# Patient Record
Sex: Female | Born: 1935 | Race: White | Hispanic: No | Smoking: Former smoker
Health system: Southern US, Community
[De-identification: ages and names within clinical notes are randomized; demographics above are authoritative.]

## PROBLEM LIST (undated history)

## (undated) DIAGNOSIS — I8390 Asymptomatic varicose veins of unspecified lower extremity: Secondary | ICD-10-CM

## (undated) DIAGNOSIS — M810 Age-related osteoporosis without current pathological fracture: Secondary | ICD-10-CM

## (undated) DIAGNOSIS — H3530 Unspecified macular degeneration: Secondary | ICD-10-CM

## (undated) DIAGNOSIS — F4321 Adjustment disorder with depressed mood: Secondary | ICD-10-CM

## (undated) DIAGNOSIS — M79606 Pain in leg, unspecified: Secondary | ICD-10-CM

## (undated) DIAGNOSIS — G4700 Insomnia, unspecified: Secondary | ICD-10-CM

## (undated) DIAGNOSIS — J449 Chronic obstructive pulmonary disease, unspecified: Secondary | ICD-10-CM

## (undated) DIAGNOSIS — I10 Essential (primary) hypertension: Secondary | ICD-10-CM

## (undated) DIAGNOSIS — E785 Hyperlipidemia, unspecified: Secondary | ICD-10-CM

## (undated) HISTORY — DX: Essential (primary) hypertension: I10

## (undated) HISTORY — DX: Unspecified macular degeneration: H35.30

## (undated) HISTORY — DX: Adjustment disorder with depressed mood: F43.21

## (undated) HISTORY — DX: Asymptomatic varicose veins of unspecified lower extremity: I83.90

## (undated) HISTORY — DX: Insomnia, unspecified: G47.00

## (undated) HISTORY — DX: Hyperlipidemia, unspecified: E78.5

## (undated) HISTORY — DX: Chronic obstructive pulmonary disease, unspecified: J44.9

## (undated) HISTORY — DX: Age-related osteoporosis without current pathological fracture: M81.0

## (undated) HISTORY — PX: OTHER SURGICAL HISTORY: SHX169

## (undated) HISTORY — DX: Pain in leg, unspecified: M79.606

---

## 1973-10-06 HISTORY — PX: APPENDECTOMY: SHX54

## 1998-03-29 ENCOUNTER — Other Ambulatory Visit: Admission: RE | Admit: 1998-03-29 | Discharge: 1998-03-29 | Payer: Self-pay | Admitting: Obstetrics and Gynecology

## 2011-11-17 DIAGNOSIS — B9789 Other viral agents as the cause of diseases classified elsewhere: Secondary | ICD-10-CM | POA: Diagnosis not present

## 2011-11-28 DIAGNOSIS — J449 Chronic obstructive pulmonary disease, unspecified: Secondary | ICD-10-CM | POA: Diagnosis not present

## 2011-11-28 DIAGNOSIS — R05 Cough: Secondary | ICD-10-CM | POA: Diagnosis not present

## 2011-11-28 DIAGNOSIS — J42 Unspecified chronic bronchitis: Secondary | ICD-10-CM | POA: Diagnosis not present

## 2011-11-28 DIAGNOSIS — J310 Chronic rhinitis: Secondary | ICD-10-CM | POA: Diagnosis not present

## 2011-12-12 DIAGNOSIS — J4489 Other specified chronic obstructive pulmonary disease: Secondary | ICD-10-CM | POA: Diagnosis not present

## 2011-12-12 DIAGNOSIS — J310 Chronic rhinitis: Secondary | ICD-10-CM | POA: Diagnosis not present

## 2011-12-12 DIAGNOSIS — R059 Cough, unspecified: Secondary | ICD-10-CM | POA: Diagnosis not present

## 2011-12-12 DIAGNOSIS — J449 Chronic obstructive pulmonary disease, unspecified: Secondary | ICD-10-CM | POA: Diagnosis not present

## 2011-12-12 DIAGNOSIS — R05 Cough: Secondary | ICD-10-CM | POA: Diagnosis not present

## 2011-12-22 DIAGNOSIS — M2040 Other hammer toe(s) (acquired), unspecified foot: Secondary | ICD-10-CM | POA: Diagnosis not present

## 2011-12-22 DIAGNOSIS — L84 Corns and callosities: Secondary | ICD-10-CM | POA: Diagnosis not present

## 2012-05-19 DIAGNOSIS — M1710 Unilateral primary osteoarthritis, unspecified knee: Secondary | ICD-10-CM | POA: Diagnosis not present

## 2012-05-19 DIAGNOSIS — M25569 Pain in unspecified knee: Secondary | ICD-10-CM | POA: Diagnosis not present

## 2012-05-28 DIAGNOSIS — J209 Acute bronchitis, unspecified: Secondary | ICD-10-CM | POA: Diagnosis not present

## 2012-05-28 DIAGNOSIS — J449 Chronic obstructive pulmonary disease, unspecified: Secondary | ICD-10-CM | POA: Diagnosis not present

## 2012-05-28 DIAGNOSIS — J0100 Acute maxillary sinusitis, unspecified: Secondary | ICD-10-CM | POA: Diagnosis not present

## 2012-05-28 DIAGNOSIS — M25569 Pain in unspecified knee: Secondary | ICD-10-CM | POA: Diagnosis not present

## 2012-06-10 DIAGNOSIS — S53126A Posterior dislocation of unspecified ulnohumeral joint, initial encounter: Secondary | ICD-10-CM | POA: Diagnosis not present

## 2012-06-10 DIAGNOSIS — W010XXA Fall on same level from slipping, tripping and stumbling without subsequent striking against object, initial encounter: Secondary | ICD-10-CM | POA: Diagnosis not present

## 2012-06-10 DIAGNOSIS — J449 Chronic obstructive pulmonary disease, unspecified: Secondary | ICD-10-CM | POA: Diagnosis not present

## 2012-06-10 DIAGNOSIS — Z87891 Personal history of nicotine dependence: Secondary | ICD-10-CM | POA: Diagnosis not present

## 2012-06-10 DIAGNOSIS — S52023A Displaced fracture of olecranon process without intraarticular extension of unspecified ulna, initial encounter for closed fracture: Secondary | ICD-10-CM | POA: Diagnosis not present

## 2012-06-10 DIAGNOSIS — J4489 Other specified chronic obstructive pulmonary disease: Secondary | ICD-10-CM | POA: Diagnosis not present

## 2012-06-10 DIAGNOSIS — S93409A Sprain of unspecified ligament of unspecified ankle, initial encounter: Secondary | ICD-10-CM | POA: Diagnosis not present

## 2012-06-23 DIAGNOSIS — S52043A Displaced fracture of coronoid process of unspecified ulna, initial encounter for closed fracture: Secondary | ICD-10-CM | POA: Diagnosis not present

## 2012-06-28 DIAGNOSIS — S52043A Displaced fracture of coronoid process of unspecified ulna, initial encounter for closed fracture: Secondary | ICD-10-CM | POA: Diagnosis not present

## 2012-06-28 DIAGNOSIS — M25529 Pain in unspecified elbow: Secondary | ICD-10-CM | POA: Diagnosis not present

## 2012-07-02 DIAGNOSIS — M25529 Pain in unspecified elbow: Secondary | ICD-10-CM | POA: Diagnosis not present

## 2012-07-05 DIAGNOSIS — M25529 Pain in unspecified elbow: Secondary | ICD-10-CM | POA: Diagnosis not present

## 2012-07-06 ENCOUNTER — Telehealth: Payer: Self-pay | Admitting: Pulmonary Disease

## 2012-07-06 NOTE — Telephone Encounter (Signed)
I spoke with Ms. Sylvia Suarez and informed her that the results of her Inspiratory and expiratory pressure testing was remarkably low, confirming my suspicion of respiratory muscle weakness.  Based on this we will refer her for neurology evaluation.

## 2012-07-06 NOTE — Telephone Encounter (Signed)
LAST NOTE WRITTEN IN ERROR

## 2012-07-09 DIAGNOSIS — M25529 Pain in unspecified elbow: Secondary | ICD-10-CM | POA: Diagnosis not present

## 2012-07-12 DIAGNOSIS — M25529 Pain in unspecified elbow: Secondary | ICD-10-CM | POA: Diagnosis not present

## 2012-07-16 DIAGNOSIS — M25529 Pain in unspecified elbow: Secondary | ICD-10-CM | POA: Diagnosis not present

## 2012-07-20 DIAGNOSIS — M25529 Pain in unspecified elbow: Secondary | ICD-10-CM | POA: Diagnosis not present

## 2012-07-21 DIAGNOSIS — S52043A Displaced fracture of coronoid process of unspecified ulna, initial encounter for closed fracture: Secondary | ICD-10-CM | POA: Diagnosis not present

## 2012-07-23 DIAGNOSIS — M25529 Pain in unspecified elbow: Secondary | ICD-10-CM | POA: Diagnosis not present

## 2012-07-27 DIAGNOSIS — M25529 Pain in unspecified elbow: Secondary | ICD-10-CM | POA: Diagnosis not present

## 2012-07-30 DIAGNOSIS — M25529 Pain in unspecified elbow: Secondary | ICD-10-CM | POA: Diagnosis not present

## 2012-08-03 DIAGNOSIS — M25529 Pain in unspecified elbow: Secondary | ICD-10-CM | POA: Diagnosis not present

## 2012-08-06 DIAGNOSIS — M25529 Pain in unspecified elbow: Secondary | ICD-10-CM | POA: Diagnosis not present

## 2012-08-13 DIAGNOSIS — M25529 Pain in unspecified elbow: Secondary | ICD-10-CM | POA: Diagnosis not present

## 2012-08-18 DIAGNOSIS — M25529 Pain in unspecified elbow: Secondary | ICD-10-CM | POA: Diagnosis not present

## 2012-08-18 DIAGNOSIS — S52043A Displaced fracture of coronoid process of unspecified ulna, initial encounter for closed fracture: Secondary | ICD-10-CM | POA: Diagnosis not present

## 2012-08-23 DIAGNOSIS — M25529 Pain in unspecified elbow: Secondary | ICD-10-CM | POA: Diagnosis not present

## 2012-08-23 DIAGNOSIS — S52043A Displaced fracture of coronoid process of unspecified ulna, initial encounter for closed fracture: Secondary | ICD-10-CM | POA: Diagnosis not present

## 2012-08-30 DIAGNOSIS — M25529 Pain in unspecified elbow: Secondary | ICD-10-CM | POA: Diagnosis not present

## 2012-08-30 DIAGNOSIS — S52043A Displaced fracture of coronoid process of unspecified ulna, initial encounter for closed fracture: Secondary | ICD-10-CM | POA: Diagnosis not present

## 2012-10-09 DIAGNOSIS — J209 Acute bronchitis, unspecified: Secondary | ICD-10-CM | POA: Diagnosis not present

## 2012-10-09 DIAGNOSIS — J0100 Acute maxillary sinusitis, unspecified: Secondary | ICD-10-CM | POA: Diagnosis not present

## 2012-10-14 DIAGNOSIS — F411 Generalized anxiety disorder: Secondary | ICD-10-CM | POA: Diagnosis not present

## 2012-10-14 DIAGNOSIS — J449 Chronic obstructive pulmonary disease, unspecified: Secondary | ICD-10-CM | POA: Diagnosis not present

## 2012-10-14 DIAGNOSIS — F410 Panic disorder [episodic paroxysmal anxiety] without agoraphobia: Secondary | ICD-10-CM | POA: Diagnosis not present

## 2012-10-14 DIAGNOSIS — I10 Essential (primary) hypertension: Secondary | ICD-10-CM | POA: Diagnosis not present

## 2012-10-15 DIAGNOSIS — I10 Essential (primary) hypertension: Secondary | ICD-10-CM | POA: Diagnosis not present

## 2012-10-15 DIAGNOSIS — F411 Generalized anxiety disorder: Secondary | ICD-10-CM | POA: Diagnosis not present

## 2012-11-02 DIAGNOSIS — R0609 Other forms of dyspnea: Secondary | ICD-10-CM | POA: Diagnosis not present

## 2012-11-02 DIAGNOSIS — R05 Cough: Secondary | ICD-10-CM | POA: Diagnosis not present

## 2012-11-02 DIAGNOSIS — E559 Vitamin D deficiency, unspecified: Secondary | ICD-10-CM | POA: Diagnosis not present

## 2012-11-02 DIAGNOSIS — R0989 Other specified symptoms and signs involving the circulatory and respiratory systems: Secondary | ICD-10-CM | POA: Diagnosis not present

## 2012-11-02 DIAGNOSIS — J310 Chronic rhinitis: Secondary | ICD-10-CM | POA: Diagnosis not present

## 2012-11-10 DIAGNOSIS — IMO0002 Reserved for concepts with insufficient information to code with codable children: Secondary | ICD-10-CM | POA: Diagnosis not present

## 2012-11-10 DIAGNOSIS — I10 Essential (primary) hypertension: Secondary | ICD-10-CM | POA: Diagnosis not present

## 2012-11-10 DIAGNOSIS — F4321 Adjustment disorder with depressed mood: Secondary | ICD-10-CM | POA: Diagnosis not present

## 2012-11-10 DIAGNOSIS — J449 Chronic obstructive pulmonary disease, unspecified: Secondary | ICD-10-CM | POA: Diagnosis not present

## 2012-11-11 DIAGNOSIS — H2589 Other age-related cataract: Secondary | ICD-10-CM | POA: Diagnosis not present

## 2012-11-11 DIAGNOSIS — H43399 Other vitreous opacities, unspecified eye: Secondary | ICD-10-CM | POA: Diagnosis not present

## 2012-11-11 DIAGNOSIS — H52229 Regular astigmatism, unspecified eye: Secondary | ICD-10-CM | POA: Diagnosis not present

## 2012-11-11 DIAGNOSIS — H5200 Hypermetropia, unspecified eye: Secondary | ICD-10-CM | POA: Diagnosis not present

## 2012-11-26 DIAGNOSIS — Z13820 Encounter for screening for osteoporosis: Secondary | ICD-10-CM | POA: Diagnosis not present

## 2012-12-13 DIAGNOSIS — J449 Chronic obstructive pulmonary disease, unspecified: Secondary | ICD-10-CM | POA: Diagnosis not present

## 2012-12-13 DIAGNOSIS — Z6825 Body mass index (BMI) 25.0-25.9, adult: Secondary | ICD-10-CM | POA: Diagnosis not present

## 2012-12-13 DIAGNOSIS — Z780 Asymptomatic menopausal state: Secondary | ICD-10-CM | POA: Diagnosis not present

## 2012-12-13 DIAGNOSIS — I10 Essential (primary) hypertension: Secondary | ICD-10-CM | POA: Diagnosis not present

## 2013-01-11 DIAGNOSIS — S52023A Displaced fracture of olecranon process without intraarticular extension of unspecified ulna, initial encounter for closed fracture: Secondary | ICD-10-CM | POA: Diagnosis not present

## 2013-01-17 DIAGNOSIS — Z0000 Encounter for general adult medical examination without abnormal findings: Secondary | ICD-10-CM | POA: Diagnosis not present

## 2013-01-17 DIAGNOSIS — Z1212 Encounter for screening for malignant neoplasm of rectum: Secondary | ICD-10-CM | POA: Diagnosis not present

## 2013-01-17 DIAGNOSIS — I10 Essential (primary) hypertension: Secondary | ICD-10-CM | POA: Diagnosis not present

## 2013-01-17 DIAGNOSIS — E785 Hyperlipidemia, unspecified: Secondary | ICD-10-CM | POA: Diagnosis not present

## 2013-01-17 DIAGNOSIS — IMO0002 Reserved for concepts with insufficient information to code with codable children: Secondary | ICD-10-CM | POA: Diagnosis not present

## 2013-01-17 DIAGNOSIS — M1710 Unilateral primary osteoarthritis, unspecified knee: Secondary | ICD-10-CM | POA: Diagnosis not present

## 2013-01-17 DIAGNOSIS — Z79899 Other long term (current) drug therapy: Secondary | ICD-10-CM | POA: Diagnosis not present

## 2013-01-17 DIAGNOSIS — E559 Vitamin D deficiency, unspecified: Secondary | ICD-10-CM | POA: Diagnosis not present

## 2013-01-24 DIAGNOSIS — Z1231 Encounter for screening mammogram for malignant neoplasm of breast: Secondary | ICD-10-CM | POA: Diagnosis not present

## 2013-03-07 DIAGNOSIS — R252 Cramp and spasm: Secondary | ICD-10-CM | POA: Diagnosis not present

## 2013-03-07 DIAGNOSIS — I10 Essential (primary) hypertension: Secondary | ICD-10-CM | POA: Diagnosis not present

## 2013-03-28 DIAGNOSIS — Z79899 Other long term (current) drug therapy: Secondary | ICD-10-CM | POA: Diagnosis not present

## 2013-03-28 DIAGNOSIS — R209 Unspecified disturbances of skin sensation: Secondary | ICD-10-CM | POA: Diagnosis not present

## 2013-03-28 DIAGNOSIS — G4700 Insomnia, unspecified: Secondary | ICD-10-CM | POA: Diagnosis not present

## 2013-03-28 DIAGNOSIS — I10 Essential (primary) hypertension: Secondary | ICD-10-CM | POA: Diagnosis not present

## 2013-03-28 DIAGNOSIS — R252 Cramp and spasm: Secondary | ICD-10-CM | POA: Diagnosis not present

## 2013-06-28 DIAGNOSIS — G4700 Insomnia, unspecified: Secondary | ICD-10-CM | POA: Diagnosis not present

## 2013-06-28 DIAGNOSIS — F4321 Adjustment disorder with depressed mood: Secondary | ICD-10-CM | POA: Diagnosis not present

## 2013-07-04 DIAGNOSIS — F5102 Adjustment insomnia: Secondary | ICD-10-CM | POA: Diagnosis not present

## 2013-07-26 DIAGNOSIS — M25569 Pain in unspecified knee: Secondary | ICD-10-CM | POA: Diagnosis not present

## 2013-07-26 DIAGNOSIS — M1710 Unilateral primary osteoarthritis, unspecified knee: Secondary | ICD-10-CM | POA: Diagnosis not present

## 2013-08-03 DIAGNOSIS — M25569 Pain in unspecified knee: Secondary | ICD-10-CM | POA: Diagnosis not present

## 2013-09-23 DIAGNOSIS — G4700 Insomnia, unspecified: Secondary | ICD-10-CM | POA: Diagnosis not present

## 2013-09-23 DIAGNOSIS — IMO0002 Reserved for concepts with insufficient information to code with codable children: Secondary | ICD-10-CM | POA: Diagnosis not present

## 2013-09-23 DIAGNOSIS — J449 Chronic obstructive pulmonary disease, unspecified: Secondary | ICD-10-CM | POA: Diagnosis not present

## 2013-09-23 DIAGNOSIS — I10 Essential (primary) hypertension: Secondary | ICD-10-CM | POA: Diagnosis not present

## 2013-10-24 DIAGNOSIS — Z79899 Other long term (current) drug therapy: Secondary | ICD-10-CM | POA: Diagnosis not present

## 2013-10-24 DIAGNOSIS — G4700 Insomnia, unspecified: Secondary | ICD-10-CM | POA: Diagnosis not present

## 2013-10-24 DIAGNOSIS — R0602 Shortness of breath: Secondary | ICD-10-CM | POA: Diagnosis not present

## 2013-10-24 DIAGNOSIS — I10 Essential (primary) hypertension: Secondary | ICD-10-CM | POA: Diagnosis not present

## 2013-10-31 DIAGNOSIS — J449 Chronic obstructive pulmonary disease, unspecified: Secondary | ICD-10-CM | POA: Diagnosis not present

## 2014-01-04 DIAGNOSIS — S52043A Displaced fracture of coronoid process of unspecified ulna, initial encounter for closed fracture: Secondary | ICD-10-CM | POA: Diagnosis not present

## 2014-01-23 DIAGNOSIS — T8489XA Other specified complication of internal orthopedic prosthetic devices, implants and grafts, initial encounter: Secondary | ICD-10-CM | POA: Diagnosis not present

## 2014-01-23 DIAGNOSIS — Z87891 Personal history of nicotine dependence: Secondary | ICD-10-CM | POA: Diagnosis not present

## 2014-01-23 DIAGNOSIS — Z79899 Other long term (current) drug therapy: Secondary | ICD-10-CM | POA: Diagnosis not present

## 2014-01-23 DIAGNOSIS — Z472 Encounter for removal of internal fixation device: Secondary | ICD-10-CM | POA: Diagnosis not present

## 2014-01-23 DIAGNOSIS — J449 Chronic obstructive pulmonary disease, unspecified: Secondary | ICD-10-CM | POA: Diagnosis not present

## 2014-01-23 DIAGNOSIS — I10 Essential (primary) hypertension: Secondary | ICD-10-CM | POA: Diagnosis not present

## 2014-01-23 DIAGNOSIS — J45909 Unspecified asthma, uncomplicated: Secondary | ICD-10-CM | POA: Diagnosis not present

## 2014-02-08 DIAGNOSIS — R0602 Shortness of breath: Secondary | ICD-10-CM | POA: Diagnosis not present

## 2014-02-08 DIAGNOSIS — G4700 Insomnia, unspecified: Secondary | ICD-10-CM | POA: Diagnosis not present

## 2014-02-08 DIAGNOSIS — Z9181 History of falling: Secondary | ICD-10-CM | POA: Diagnosis not present

## 2014-02-08 DIAGNOSIS — I10 Essential (primary) hypertension: Secondary | ICD-10-CM | POA: Diagnosis not present

## 2014-02-08 DIAGNOSIS — E785 Hyperlipidemia, unspecified: Secondary | ICD-10-CM | POA: Diagnosis not present

## 2014-02-08 DIAGNOSIS — Z79899 Other long term (current) drug therapy: Secondary | ICD-10-CM | POA: Diagnosis not present

## 2014-02-08 DIAGNOSIS — Z1331 Encounter for screening for depression: Secondary | ICD-10-CM | POA: Diagnosis not present

## 2014-02-08 DIAGNOSIS — E782 Mixed hyperlipidemia: Secondary | ICD-10-CM | POA: Diagnosis not present

## 2014-03-10 DIAGNOSIS — H353190 Nonexudative age-related macular degeneration, unspecified eye, stage unspecified: Secondary | ICD-10-CM | POA: Diagnosis not present

## 2014-03-10 DIAGNOSIS — H5200 Hypermetropia, unspecified eye: Secondary | ICD-10-CM | POA: Diagnosis not present

## 2014-03-10 DIAGNOSIS — H52229 Regular astigmatism, unspecified eye: Secondary | ICD-10-CM | POA: Diagnosis not present

## 2014-03-10 DIAGNOSIS — H2589 Other age-related cataract: Secondary | ICD-10-CM | POA: Diagnosis not present

## 2014-05-10 DIAGNOSIS — M1710 Unilateral primary osteoarthritis, unspecified knee: Secondary | ICD-10-CM | POA: Diagnosis not present

## 2014-06-07 DIAGNOSIS — M1710 Unilateral primary osteoarthritis, unspecified knee: Secondary | ICD-10-CM | POA: Diagnosis not present

## 2014-07-17 DIAGNOSIS — Z79899 Other long term (current) drug therapy: Secondary | ICD-10-CM | POA: Diagnosis not present

## 2014-07-17 DIAGNOSIS — E785 Hyperlipidemia, unspecified: Secondary | ICD-10-CM | POA: Diagnosis not present

## 2014-07-17 DIAGNOSIS — R0602 Shortness of breath: Secondary | ICD-10-CM | POA: Diagnosis not present

## 2014-07-17 DIAGNOSIS — Z23 Encounter for immunization: Secondary | ICD-10-CM | POA: Diagnosis not present

## 2014-07-17 DIAGNOSIS — I10 Essential (primary) hypertension: Secondary | ICD-10-CM | POA: Diagnosis not present

## 2014-08-28 DIAGNOSIS — J209 Acute bronchitis, unspecified: Secondary | ICD-10-CM | POA: Diagnosis not present

## 2014-09-01 DIAGNOSIS — J449 Chronic obstructive pulmonary disease, unspecified: Secondary | ICD-10-CM | POA: Diagnosis not present

## 2014-09-27 DIAGNOSIS — Z681 Body mass index (BMI) 19 or less, adult: Secondary | ICD-10-CM | POA: Diagnosis not present

## 2014-09-27 DIAGNOSIS — J209 Acute bronchitis, unspecified: Secondary | ICD-10-CM | POA: Diagnosis not present

## 2014-10-18 DIAGNOSIS — E785 Hyperlipidemia, unspecified: Secondary | ICD-10-CM | POA: Diagnosis not present

## 2014-10-18 DIAGNOSIS — I10 Essential (primary) hypertension: Secondary | ICD-10-CM | POA: Diagnosis not present

## 2014-10-18 DIAGNOSIS — Z79899 Other long term (current) drug therapy: Secondary | ICD-10-CM | POA: Diagnosis not present

## 2014-10-18 DIAGNOSIS — G4700 Insomnia, unspecified: Secondary | ICD-10-CM | POA: Diagnosis not present

## 2014-10-18 DIAGNOSIS — Z681 Body mass index (BMI) 19 or less, adult: Secondary | ICD-10-CM | POA: Diagnosis not present

## 2014-10-18 DIAGNOSIS — J449 Chronic obstructive pulmonary disease, unspecified: Secondary | ICD-10-CM | POA: Diagnosis not present

## 2014-11-06 DIAGNOSIS — H43812 Vitreous degeneration, left eye: Secondary | ICD-10-CM | POA: Diagnosis not present

## 2014-11-06 DIAGNOSIS — I10 Essential (primary) hypertension: Secondary | ICD-10-CM | POA: Diagnosis not present

## 2014-11-06 DIAGNOSIS — D3130 Benign neoplasm of unspecified choroid: Secondary | ICD-10-CM | POA: Diagnosis not present

## 2014-12-07 DIAGNOSIS — H3531 Nonexudative age-related macular degeneration: Secondary | ICD-10-CM | POA: Diagnosis not present

## 2015-01-11 DIAGNOSIS — H3531 Nonexudative age-related macular degeneration: Secondary | ICD-10-CM | POA: Diagnosis not present

## 2015-01-17 DIAGNOSIS — I10 Essential (primary) hypertension: Secondary | ICD-10-CM | POA: Diagnosis not present

## 2015-01-17 DIAGNOSIS — R0602 Shortness of breath: Secondary | ICD-10-CM | POA: Diagnosis not present

## 2015-01-17 DIAGNOSIS — E782 Mixed hyperlipidemia: Secondary | ICD-10-CM | POA: Diagnosis not present

## 2015-01-17 DIAGNOSIS — Z6822 Body mass index (BMI) 22.0-22.9, adult: Secondary | ICD-10-CM | POA: Diagnosis not present

## 2015-01-17 DIAGNOSIS — E785 Hyperlipidemia, unspecified: Secondary | ICD-10-CM | POA: Diagnosis not present

## 2015-01-17 DIAGNOSIS — Z79899 Other long term (current) drug therapy: Secondary | ICD-10-CM | POA: Diagnosis not present

## 2015-01-17 DIAGNOSIS — F5104 Psychophysiologic insomnia: Secondary | ICD-10-CM | POA: Diagnosis not present

## 2015-01-17 DIAGNOSIS — J449 Chronic obstructive pulmonary disease, unspecified: Secondary | ICD-10-CM | POA: Diagnosis not present

## 2015-01-27 DIAGNOSIS — J441 Chronic obstructive pulmonary disease with (acute) exacerbation: Secondary | ICD-10-CM | POA: Diagnosis not present

## 2015-05-01 DIAGNOSIS — Z79899 Other long term (current) drug therapy: Secondary | ICD-10-CM | POA: Diagnosis not present

## 2015-05-01 DIAGNOSIS — Z9181 History of falling: Secondary | ICD-10-CM | POA: Diagnosis not present

## 2015-05-01 DIAGNOSIS — Z1231 Encounter for screening mammogram for malignant neoplasm of breast: Secondary | ICD-10-CM | POA: Diagnosis not present

## 2015-05-01 DIAGNOSIS — E785 Hyperlipidemia, unspecified: Secondary | ICD-10-CM | POA: Diagnosis not present

## 2015-05-01 DIAGNOSIS — J449 Chronic obstructive pulmonary disease, unspecified: Secondary | ICD-10-CM | POA: Diagnosis not present

## 2015-05-01 DIAGNOSIS — F5104 Psychophysiologic insomnia: Secondary | ICD-10-CM | POA: Diagnosis not present

## 2015-05-01 DIAGNOSIS — I10 Essential (primary) hypertension: Secondary | ICD-10-CM | POA: Diagnosis not present

## 2015-05-01 DIAGNOSIS — Z6822 Body mass index (BMI) 22.0-22.9, adult: Secondary | ICD-10-CM | POA: Diagnosis not present

## 2015-05-01 DIAGNOSIS — M8589 Other specified disorders of bone density and structure, multiple sites: Secondary | ICD-10-CM | POA: Diagnosis not present

## 2015-05-02 DIAGNOSIS — H3531 Nonexudative age-related macular degeneration: Secondary | ICD-10-CM | POA: Diagnosis not present

## 2015-05-02 DIAGNOSIS — I10 Essential (primary) hypertension: Secondary | ICD-10-CM | POA: Diagnosis not present

## 2015-05-21 DIAGNOSIS — M810 Age-related osteoporosis without current pathological fracture: Secondary | ICD-10-CM | POA: Diagnosis not present

## 2015-05-21 DIAGNOSIS — Z1231 Encounter for screening mammogram for malignant neoplasm of breast: Secondary | ICD-10-CM | POA: Diagnosis not present

## 2015-05-21 DIAGNOSIS — M8589 Other specified disorders of bone density and structure, multiple sites: Secondary | ICD-10-CM | POA: Diagnosis not present

## 2015-07-05 DIAGNOSIS — Z6822 Body mass index (BMI) 22.0-22.9, adult: Secondary | ICD-10-CM | POA: Diagnosis not present

## 2015-07-05 DIAGNOSIS — I10 Essential (primary) hypertension: Secondary | ICD-10-CM | POA: Diagnosis not present

## 2015-07-05 DIAGNOSIS — E785 Hyperlipidemia, unspecified: Secondary | ICD-10-CM | POA: Diagnosis not present

## 2015-07-05 DIAGNOSIS — Z23 Encounter for immunization: Secondary | ICD-10-CM | POA: Diagnosis not present

## 2015-07-05 DIAGNOSIS — F5104 Psychophysiologic insomnia: Secondary | ICD-10-CM | POA: Diagnosis not present

## 2015-07-05 DIAGNOSIS — R21 Rash and other nonspecific skin eruption: Secondary | ICD-10-CM | POA: Diagnosis not present

## 2015-07-05 DIAGNOSIS — Z79899 Other long term (current) drug therapy: Secondary | ICD-10-CM | POA: Diagnosis not present

## 2015-07-05 DIAGNOSIS — I83899 Varicose veins of unspecified lower extremities with other complications: Secondary | ICD-10-CM | POA: Diagnosis not present

## 2015-07-16 DIAGNOSIS — M1710 Unilateral primary osteoarthritis, unspecified knee: Secondary | ICD-10-CM | POA: Diagnosis not present

## 2015-07-16 DIAGNOSIS — M1712 Unilateral primary osteoarthritis, left knee: Secondary | ICD-10-CM | POA: Diagnosis not present

## 2015-07-23 DIAGNOSIS — J449 Chronic obstructive pulmonary disease, unspecified: Secondary | ICD-10-CM | POA: Diagnosis not present

## 2015-07-23 DIAGNOSIS — M1711 Unilateral primary osteoarthritis, right knee: Secondary | ICD-10-CM | POA: Diagnosis not present

## 2015-08-08 ENCOUNTER — Other Ambulatory Visit: Payer: Self-pay | Admitting: *Deleted

## 2015-08-08 ENCOUNTER — Encounter: Payer: Self-pay | Admitting: Vascular Surgery

## 2015-08-08 DIAGNOSIS — I83813 Varicose veins of bilateral lower extremities with pain: Secondary | ICD-10-CM

## 2015-09-26 ENCOUNTER — Encounter: Payer: Self-pay | Admitting: Vascular Surgery

## 2015-10-01 DIAGNOSIS — J0100 Acute maxillary sinusitis, unspecified: Secondary | ICD-10-CM | POA: Diagnosis not present

## 2015-10-01 DIAGNOSIS — J209 Acute bronchitis, unspecified: Secondary | ICD-10-CM | POA: Diagnosis not present

## 2015-10-02 ENCOUNTER — Encounter: Payer: Medicare Other | Admitting: Vascular Surgery

## 2015-10-02 ENCOUNTER — Inpatient Hospital Stay (HOSPITAL_COMMUNITY): Admission: RE | Admit: 2015-10-02 | Payer: Medicare Other | Source: Ambulatory Visit

## 2015-10-04 DIAGNOSIS — F5104 Psychophysiologic insomnia: Secondary | ICD-10-CM | POA: Diagnosis not present

## 2015-10-04 DIAGNOSIS — I10 Essential (primary) hypertension: Secondary | ICD-10-CM | POA: Diagnosis not present

## 2015-10-04 DIAGNOSIS — Z79899 Other long term (current) drug therapy: Secondary | ICD-10-CM | POA: Diagnosis not present

## 2015-10-04 DIAGNOSIS — E785 Hyperlipidemia, unspecified: Secondary | ICD-10-CM | POA: Diagnosis not present

## 2015-10-04 DIAGNOSIS — J069 Acute upper respiratory infection, unspecified: Secondary | ICD-10-CM | POA: Diagnosis not present

## 2015-10-04 DIAGNOSIS — M810 Age-related osteoporosis without current pathological fracture: Secondary | ICD-10-CM | POA: Diagnosis not present

## 2015-10-04 DIAGNOSIS — Z6822 Body mass index (BMI) 22.0-22.9, adult: Secondary | ICD-10-CM | POA: Diagnosis not present

## 2015-10-04 DIAGNOSIS — J449 Chronic obstructive pulmonary disease, unspecified: Secondary | ICD-10-CM | POA: Diagnosis not present

## 2015-11-06 DIAGNOSIS — M1712 Unilateral primary osteoarthritis, left knee: Secondary | ICD-10-CM | POA: Diagnosis not present

## 2015-11-07 ENCOUNTER — Encounter: Payer: Self-pay | Admitting: Vascular Surgery

## 2015-11-07 DIAGNOSIS — H5203 Hypermetropia, bilateral: Secondary | ICD-10-CM | POA: Diagnosis not present

## 2015-11-07 DIAGNOSIS — H3530 Unspecified macular degeneration: Secondary | ICD-10-CM | POA: Diagnosis not present

## 2015-11-07 DIAGNOSIS — H353131 Nonexudative age-related macular degeneration, bilateral, early dry stage: Secondary | ICD-10-CM | POA: Diagnosis not present

## 2015-11-07 DIAGNOSIS — H524 Presbyopia: Secondary | ICD-10-CM | POA: Diagnosis not present

## 2015-11-07 DIAGNOSIS — H52223 Regular astigmatism, bilateral: Secondary | ICD-10-CM | POA: Diagnosis not present

## 2015-11-07 DIAGNOSIS — H25813 Combined forms of age-related cataract, bilateral: Secondary | ICD-10-CM | POA: Diagnosis not present

## 2015-11-07 DIAGNOSIS — H43813 Vitreous degeneration, bilateral: Secondary | ICD-10-CM | POA: Diagnosis not present

## 2015-11-07 DIAGNOSIS — D3130 Benign neoplasm of unspecified choroid: Secondary | ICD-10-CM | POA: Diagnosis not present

## 2015-11-08 ENCOUNTER — Ambulatory Visit (INDEPENDENT_AMBULATORY_CARE_PROVIDER_SITE_OTHER): Payer: Medicare Other | Admitting: Vascular Surgery

## 2015-11-08 ENCOUNTER — Encounter: Payer: Self-pay | Admitting: Vascular Surgery

## 2015-11-08 ENCOUNTER — Encounter (INDEPENDENT_AMBULATORY_CARE_PROVIDER_SITE_OTHER): Payer: Self-pay

## 2015-11-08 VITALS — BP 172/86 | HR 76 | Temp 97.4°F | Resp 16 | Ht 65.5 in | Wt 152.6 lb

## 2015-11-08 DIAGNOSIS — I83893 Varicose veins of bilateral lower extremities with other complications: Secondary | ICD-10-CM | POA: Diagnosis not present

## 2015-11-08 NOTE — Addendum Note (Signed)
Addended by: Reola Calkins on: 11/08/2015 01:34 PM   Modules accepted: Orders

## 2015-11-08 NOTE — Progress Notes (Signed)
Vascular and Vein Specialist of [facility]  Patient name: Sylvia Suarez MRN: [id number] DOB: 08-16-1936 Sex: female  REASON FOR CONSULT:  Bilateral painful varicosities right greater than left  HPI: Sylvia Suarez is a 80 y.o. female, who is  Seen today for discussion of bilateral painful varicosities. She reports that these are 7 out of 10 for discomfort most particularly in her right leg and over the varicosities in her medial distal thigh. She has no history of DVT and no history of arterial insufficiency. She has no history of venous ulcers. She does have bilateral lower extremity swelling which is relieved with elevation.  Past Medical History  Diagnosis Date  . Hyperlipidemia   . Hypertension   . Insomnia   . Varicose veins   . Leg pain   . COPD (chronic obstructive pulmonary disease) ([place])   . Grief   . Macular degeneration, left eye   . Osteoporosis   . Macular degeneration of right eye     Family History  Problem Relation Age of Onset  . Stroke Mother   . Hyperlipidemia Mother   . Arthritis Mother   . Hypertension Mother   . Diabetes Mother   . Varicose Veins Mother   . Arthritis Sister   . Hypertension Sister   . Diabetes Sister   . Heart disease Sister   . Arthritis Brother   . Hypertension Brother   . Heart disease Brother     SOCIAL HISTORY: Social History   Social History  . Marital Status: Married    Spouse Name: N/A  . Number of Children: N/A  . Years of Education: N/A   Occupational History  . Not on file.   Social History Main Topics  . Smoking status: Former Smoker -- 35 years    Quit date: 10/07/1995  . Smokeless tobacco: Not on file  . Alcohol Use: 1.2 oz/week    2 Shots of liquor per week     Comment: twice weekly  . Drug Use: No  . Sexual Activity: Not on file   Other Topics Concern  . Not on file   Social History Narrative    Allergies  Allergen Reactions  . Venomil Wasp Venom [Wasp Venom] Anaphylaxis  . Ace  Inhibitors Cough  . Influenza Vac Split Quad     Patient states she is allergic to flu shot  . Lipitor [Atorvastatin]     aches  . Oxycodone Hcl     Tongue swelling   . Venomil Yellow Jacket Venom [White Faced Hornet Venom]     Swollen lips, tongue swelling   . Zetia [Ezetimibe]     Muscle aches  . Keflex [Cephalexin] Rash    Swollen lips, tongue swelling  . Penicillins Rash    Swollen lips, tongue swelling     Current Outpatient Prescriptions  Medication Sig Dispense Refill  . albuterol (PROAIR HFA) 108 (90 BASE) MCG/ACT inhaler Inhale into the lungs every 6 (six) hours as needed for wheezing or shortness of breath.    Marland Kitchen alendronate (FOSAMAX) 70 MG tablet Take 70 mg by mouth once a week. Take with a full glass of water on an empty stomach.    . ALPRAZolam (XANAX) 0.5 MG tablet Take 0.5 mg by mouth as needed for anxiety.    . Calcium Carbonate-Vitamin D (CALTRATE 600+D) 600-400 MG-UNIT tablet Take 1 tablet by mouth daily.    Marland Kitchen diltiazem (CARDIZEM CD) 180 MG 24 hr capsule Take 180 mg by mouth daily.    Marland Kitchen  Multiple Vitamins-Minerals (PRESERVISION/LUTEIN PO) Take by mouth daily.    . Omega-3 Fatty Acids (FISH OIL) 1000 MG CAPS Take by mouth daily. TAKES 2 TABLETS DAILY.    . rosuvastatin (CRESTOR) 10 MG tablet Take 10 mg by mouth daily.    Marland Kitchen tiotropium (SPIRIVA) 18 MCG inhalation capsule Place 18 mcg into inhaler and inhale daily.     No current facility-administered medications for this visit.    REVIEW OF SYSTEMS:  '[X]'$  denotes positive finding, '[ ]'$  denotes negative finding Cardiac  Comments:  Chest pain or chest pressure:    Shortness of breath upon exertion:    Short of breath when lying flat:    Irregular heart rhythm:        Vascular    Pain in calf, thigh, or hip brought on by ambulation:    Pain in feet at night that wakes you up from your sleep:     Blood clot in your veins:    Leg swelling:  x       Pulmonary    Oxygen at home:    Productive cough:       Wheezing:  x       Neurologic    Sudden weakness in arms or legs:     Sudden numbness in arms or legs:     Sudden onset of difficulty speaking or slurred speech:    Temporary loss of vision in one eye:     Problems with dizziness:         Gastrointestinal    Blood in stool:     Vomited blood:         Genitourinary    Burning when urinating:     Blood in urine:        Psychiatric    Major depression:         Hematologic    Bleeding problems:    Problems with blood clotting too easily:        Skin    Rashes or ulcers:        Constitutional    Fever or chills:      PHYSICAL EXAM: Filed Vitals:   11/08/15 1101 11/08/15 1105  BP: 172/90 172/86  Pulse: 76   Temp: 97.4 F (36.3 C)   TempSrc: Oral   Resp: 16   Height: 5' 5.5" (1.664 m)   Weight: 152 lb 9.6 oz (69.219 kg)   SpO2: 95%     GENERAL: The patient is a well-nourished female, in no acute distress. The vital signs are documented above. CARDIAC: There is a regular rate and rhythm.  VASCULAR:  2+ radial and 2+ dorsalis pedis pulses bilaterally PULMONARY: There is good air exchange bilaterally without wheezing or rales. ABDOMEN: Soft and non-tender with normal pitched bowel sounds.  MUSCULOSKELETAL: There are no major deformities or cyanosis. NEUROLOGIC: No focal weakness or paresthesias are detected. SKIN: There are no ulcers or rashes noted. PSYCHIATRIC: The patient has a normal affect.  she does have prominent varicosities in the medial aspect of her thigh just above her knee. DATA:   I imaged both legs with SonoSite ultrasound. She is duplex. This revealed the saphenous vein coming out of the fascia and mid thigh on the right extending directly into these large varicosities. On the left leg her saphenous vein was dilated and appear to have reflux as well.  MEDICAL ISSUES:  had long discussion with the patient and her daughter-in-law present. Explained that this is not dangerous. Explained that  her  arterial exam is totally normal. I have the suggested thigh high 20-30 mmHg graduated compression garments for symptom relief. She does have significant arthritis and is having shots for this. I'm concerned about her abilities BLE tolerating compression garments. She will continue to elevate her legs when possible and also take ibuprofen for discomfort. We will see her again in 3 months for formal duplex and continued discussion.   Curt Jews [facility] [place]: [phone]

## 2015-11-13 DIAGNOSIS — M1711 Unilateral primary osteoarthritis, right knee: Secondary | ICD-10-CM | POA: Diagnosis not present

## 2015-11-26 DIAGNOSIS — Z6822 Body mass index (BMI) 22.0-22.9, adult: Secondary | ICD-10-CM | POA: Diagnosis not present

## 2015-11-26 DIAGNOSIS — M25519 Pain in unspecified shoulder: Secondary | ICD-10-CM | POA: Diagnosis not present

## 2015-11-26 DIAGNOSIS — I10 Essential (primary) hypertension: Secondary | ICD-10-CM | POA: Diagnosis not present

## 2015-11-30 DIAGNOSIS — M79601 Pain in right arm: Secondary | ICD-10-CM | POA: Diagnosis not present

## 2015-12-04 ENCOUNTER — Encounter (HOSPITAL_COMMUNITY): Payer: Medicare Other

## 2015-12-04 ENCOUNTER — Encounter: Payer: Medicare Other | Admitting: Vascular Surgery

## 2015-12-20 DIAGNOSIS — M79601 Pain in right arm: Secondary | ICD-10-CM | POA: Diagnosis not present

## 2016-01-02 DIAGNOSIS — I10 Essential (primary) hypertension: Secondary | ICD-10-CM | POA: Diagnosis not present

## 2016-01-02 DIAGNOSIS — R0602 Shortness of breath: Secondary | ICD-10-CM | POA: Diagnosis not present

## 2016-01-02 DIAGNOSIS — J449 Chronic obstructive pulmonary disease, unspecified: Secondary | ICD-10-CM | POA: Diagnosis not present

## 2016-01-02 DIAGNOSIS — Z79899 Other long term (current) drug therapy: Secondary | ICD-10-CM | POA: Diagnosis not present

## 2016-01-02 DIAGNOSIS — E785 Hyperlipidemia, unspecified: Secondary | ICD-10-CM | POA: Diagnosis not present

## 2016-01-02 DIAGNOSIS — F5104 Psychophysiologic insomnia: Secondary | ICD-10-CM | POA: Diagnosis not present

## 2016-01-02 DIAGNOSIS — M810 Age-related osteoporosis without current pathological fracture: Secondary | ICD-10-CM | POA: Diagnosis not present

## 2016-01-02 DIAGNOSIS — Z6822 Body mass index (BMI) 22.0-22.9, adult: Secondary | ICD-10-CM | POA: Diagnosis not present

## 2016-01-15 DIAGNOSIS — M79601 Pain in right arm: Secondary | ICD-10-CM | POA: Diagnosis not present

## 2016-01-24 DIAGNOSIS — M79601 Pain in right arm: Secondary | ICD-10-CM | POA: Diagnosis not present

## 2016-01-24 DIAGNOSIS — M6281 Muscle weakness (generalized): Secondary | ICD-10-CM | POA: Diagnosis not present

## 2016-01-24 DIAGNOSIS — M25511 Pain in right shoulder: Secondary | ICD-10-CM | POA: Diagnosis not present

## 2016-01-30 DIAGNOSIS — M6281 Muscle weakness (generalized): Secondary | ICD-10-CM | POA: Diagnosis not present

## 2016-01-30 DIAGNOSIS — M79601 Pain in right arm: Secondary | ICD-10-CM | POA: Diagnosis not present

## 2016-01-30 DIAGNOSIS — M25511 Pain in right shoulder: Secondary | ICD-10-CM | POA: Diagnosis not present

## 2016-01-31 ENCOUNTER — Encounter: Payer: Self-pay | Admitting: Vascular Surgery

## 2016-02-05 ENCOUNTER — Ambulatory Visit (INDEPENDENT_AMBULATORY_CARE_PROVIDER_SITE_OTHER): Payer: Medicare Other | Admitting: Vascular Surgery

## 2016-02-05 ENCOUNTER — Ambulatory Visit (HOSPITAL_COMMUNITY)
Admission: RE | Admit: 2016-02-05 | Discharge: 2016-02-05 | Disposition: A | Discharge status: Home / Self Care | Payer: Medicare Other | Source: Ambulatory Visit | Attending: Vascular Surgery | Admitting: Vascular Surgery

## 2016-02-05 ENCOUNTER — Encounter: Payer: Self-pay | Admitting: Vascular Surgery

## 2016-02-05 VITALS — BP 138/84 | HR 75 | Temp 97.6°F | Resp 18 | Ht 65.5 in | Wt 155.4 lb

## 2016-02-05 DIAGNOSIS — I83893 Varicose veins of bilateral lower extremities with other complications: Secondary | ICD-10-CM | POA: Diagnosis not present

## 2016-02-05 DIAGNOSIS — I10 Essential (primary) hypertension: Secondary | ICD-10-CM | POA: Diagnosis not present

## 2016-02-05 DIAGNOSIS — R609 Edema, unspecified: Secondary | ICD-10-CM | POA: Diagnosis present

## 2016-02-05 DIAGNOSIS — E785 Hyperlipidemia, unspecified: Secondary | ICD-10-CM | POA: Insufficient documentation

## 2016-02-05 NOTE — Progress Notes (Signed)
.Problems with Activities of Daily Living Secondary to Leg Pain  1. Sylvia Suarez states all activities that require prolonged standing (cooking, cleaning, shopping) are very difficult due to leg pain.    2. Sylvia Suarez states that she has difficulty going up and down stairs due to leg pain.    3. Sylvia Suarez states that bending and squatting is very difficult for her due to leg pain.    Failure of  Conservative Therapy:  1. Worn 20-30 mm Hg thigh high compression hose >3 months with no relief of symptoms.  2. Frequently elevates legs-no relief of symptoms  3. Taken Ibuprofen 600 Mg TID with no relief of symptoms.    Vascular and Vein Specialist of [facility]  Patient name: Sylvia Suarez MRN: [id number] DOB: 1936/06/17 Sex: female  REASON FOR VISIT: Here today for continued discussion of venous hypertension. She has been compliant with her graduated compression garments  HPI: Sylvia Suarez is a 80 y.o. female in today for continued discussion of venous hypertension. She did undergo formal venous duplex today and I'm discussing this with her and her daughter present as well. She has been compressed compliant with her compression. She continues to have a Rollene Rotunda most specifically over the large varicosities in her medial calf on the right leg. She does have some discomfort in her left calf and also has knee pain. Reports that she's had no DVT  Past Medical History  Diagnosis Date  . Hyperlipidemia   . Hypertension   . Insomnia   . Varicose veins   . Leg pain   . COPD (chronic obstructive pulmonary disease) ([place])   . Grief   . Macular degeneration, left eye   . Osteoporosis   . Macular degeneration of right eye     Family History  Problem Relation Age of Onset  . Stroke Mother   . Hyperlipidemia Mother   . Arthritis Mother   . Hypertension Mother   . Diabetes Mother   . Varicose Veins Mother   . Arthritis Sister   . Hypertension Sister   . Diabetes Sister   .  Heart disease Sister   . Arthritis Brother   . Hypertension Brother   . Heart disease Brother     SOCIAL HISTORY: Social History  Substance Use Topics  . Smoking status: Former Smoker -- 4 years    Quit date: 10/07/1995  . Smokeless tobacco: Not on file  . Alcohol Use: 1.2 oz/week    2 Shots of liquor per week     Comment: twice weekly    Allergies  Allergen Reactions  . Venomil Wasp Venom [Wasp Venom] Anaphylaxis  . Ace Inhibitors Cough  . Influenza Vac Split Quad     Patient states she is allergic to flu shot  . Lipitor [Atorvastatin]     aches  . Oxycodone Hcl     Tongue swelling   . Venomil Yellow Jacket Venom [White Faced Hornet Venom]     Swollen lips, tongue swelling   . Zetia [Ezetimibe]     Muscle aches  . Keflex [Cephalexin] Rash    Swollen lips, tongue swelling  . Penicillins Rash    Swollen lips, tongue swelling     Current Outpatient Prescriptions  Medication Sig Dispense Refill  . albuterol (PROAIR HFA) 108 (90 BASE) MCG/ACT inhaler Inhale into the lungs every 6 (six) hours as needed for wheezing or shortness of breath.    Marland Kitchen alendronate (FOSAMAX) 70 MG tablet Take 70 mg by mouth once  a week. Take with a full glass of water on an empty stomach.    . ALPRAZolam (XANAX) 0.5 MG tablet Take 0.5 mg by mouth as needed for anxiety.    . Calcium Carbonate-Vitamin D (CALTRATE 600+D) 600-400 MG-UNIT tablet Take 1 tablet by mouth daily.    Marland Kitchen diltiazem (CARDIZEM CD) 180 MG 24 hr capsule Take 180 mg by mouth daily.    . Multiple Vitamins-Minerals (PRESERVISION/LUTEIN PO) Take by mouth daily.    . Omega-3 Fatty Acids (FISH OIL) 1000 MG CAPS Take by mouth daily. TAKES 2 TABLETS DAILY.    . rosuvastatin (CRESTOR) 10 MG tablet Take 10 mg by mouth daily.    Marland Kitchen tiotropium (SPIRIVA) 18 MCG inhalation capsule Place 18 mcg into inhaler and inhale daily.     No current facility-administered medications for this visit.    REVIEW OF SYSTEMS:  '[X]'$  denotes positive finding,  '[ ]'$  denotes negative finding Cardiac  Comments:  Chest pain or chest pressure:    Shortness of breath upon exertion:    Short of breath when lying flat:    Irregular heart rhythm:        Vascular    Pain in calf, thigh, or hip brought on by ambulation:    Pain in feet at night that wakes you up from your sleep:     Blood clot in your veins:    Leg swelling:         Pulmonary    Oxygen at home:    Productive cough:     Wheezing:         Neurologic    Sudden weakness in arms or legs:     Sudden numbness in arms or legs:     Sudden onset of difficulty speaking or slurred speech:    Temporary loss of vision in one eye:     Problems with dizziness:         Gastrointestinal    Blood in stool:     Vomited blood:         Genitourinary    Burning when urinating:     Blood in urine:        Psychiatric    Major depression:         Hematologic    Bleeding problems:    Problems with blood clotting too easily:        Skin    Rashes or ulcers:        Constitutional    Fever or chills:      PHYSICAL EXAM: Filed Vitals:   02/05/16 1426 02/05/16 1432  BP: 148/84 138/84  Pulse: 75   Temp: 97.6 F (36.4 C)   TempSrc: Oral   Resp: 18   Height: 5' 5.5" (1.664 m)   Weight: 155 lb 6.4 oz (70.489 kg)   SpO2: 95%     GENERAL: The patient is a well-nourished female, in no acute distress. The vital signs are documented above. VASCULAR: Palpable or cells pedis pulses bilaterally PULMONARY: There is good air exchange  MUSCULOSKELETAL: There are no major deformities or cyanosis. NEUROLOGIC: No focal weakness or paresthesias are detected. SKIN: There are no ulcers or rashes noted. PSYCHIATRIC: The patient has a normal affect She does have large plexus of varicosities in the medial aspect of her calf below her knee on the right. Does report pain specifically over these  DATA:  Formal venous duplex today was reviewed with the patient and her daughter. This shows no evidence of  significant deep venous incompetence. She does have incompetence throughout her saphenous vein on the right extending into these varicosities. Her left leg great saphenous vein is competent except for the saphenofemoral junction  MEDICAL ISSUES: Conservative treatment of right leg venous hypertension. Have recommended laser ablation of her right great saphenous vein and stab phlebectomy of curvature varicosities in her calf. I do not see any correctable cause of hypertension on the left and she is having much less degree of symptoms. I did explain that her knee discomfort is probably related to primary knee issues and would not expect improvement with ablation. I did Splane the procedure of laser ablation of her saphenous vein and stab phlebectomy as an outpatient under local anesthesia. Explain the very slight risk of DVT associated with the procedure. She wishes to proceed as soon as possible    Early, Todd [facility] [employer]: [phone]

## 2016-02-07 ENCOUNTER — Other Ambulatory Visit: Payer: Self-pay | Admitting: *Deleted

## 2016-02-07 DIAGNOSIS — I83893 Varicose veins of bilateral lower extremities with other complications: Secondary | ICD-10-CM

## 2016-02-12 DIAGNOSIS — M1711 Unilateral primary osteoarthritis, right knee: Secondary | ICD-10-CM | POA: Diagnosis not present

## 2016-02-12 DIAGNOSIS — M1712 Unilateral primary osteoarthritis, left knee: Secondary | ICD-10-CM | POA: Diagnosis not present

## 2016-02-19 DIAGNOSIS — M1711 Unilateral primary osteoarthritis, right knee: Secondary | ICD-10-CM | POA: Diagnosis not present

## 2016-02-20 DIAGNOSIS — M810 Age-related osteoporosis without current pathological fracture: Secondary | ICD-10-CM | POA: Diagnosis not present

## 2016-02-26 DIAGNOSIS — M1712 Unilateral primary osteoarthritis, left knee: Secondary | ICD-10-CM | POA: Diagnosis not present

## 2016-03-03 DIAGNOSIS — R0602 Shortness of breath: Secondary | ICD-10-CM | POA: Diagnosis not present

## 2016-03-03 DIAGNOSIS — J209 Acute bronchitis, unspecified: Secondary | ICD-10-CM | POA: Diagnosis not present

## 2016-04-05 DIAGNOSIS — M79604 Pain in right leg: Secondary | ICD-10-CM | POA: Diagnosis not present

## 2016-04-05 DIAGNOSIS — I10 Essential (primary) hypertension: Secondary | ICD-10-CM | POA: Diagnosis not present

## 2016-04-05 DIAGNOSIS — M79605 Pain in left leg: Secondary | ICD-10-CM | POA: Diagnosis not present

## 2016-04-06 DIAGNOSIS — M79662 Pain in left lower leg: Secondary | ICD-10-CM | POA: Diagnosis not present

## 2016-04-06 DIAGNOSIS — M79604 Pain in right leg: Secondary | ICD-10-CM | POA: Diagnosis not present

## 2016-04-06 DIAGNOSIS — M79605 Pain in left leg: Secondary | ICD-10-CM | POA: Diagnosis not present

## 2016-04-11 ENCOUNTER — Encounter: Payer: Self-pay | Admitting: Vascular Surgery

## 2016-04-16 DIAGNOSIS — M79606 Pain in leg, unspecified: Secondary | ICD-10-CM | POA: Diagnosis not present

## 2016-04-16 DIAGNOSIS — Z6822 Body mass index (BMI) 22.0-22.9, adult: Secondary | ICD-10-CM | POA: Diagnosis not present

## 2016-04-16 DIAGNOSIS — I10 Essential (primary) hypertension: Secondary | ICD-10-CM | POA: Diagnosis not present

## 2016-04-16 DIAGNOSIS — Z79899 Other long term (current) drug therapy: Secondary | ICD-10-CM | POA: Diagnosis not present

## 2016-04-16 DIAGNOSIS — F5104 Psychophysiologic insomnia: Secondary | ICD-10-CM | POA: Diagnosis not present

## 2016-04-16 DIAGNOSIS — E785 Hyperlipidemia, unspecified: Secondary | ICD-10-CM | POA: Diagnosis not present

## 2016-04-17 ENCOUNTER — Other Ambulatory Visit: Payer: Medicare Other | Admitting: Vascular Surgery

## 2016-04-24 ENCOUNTER — Ambulatory Visit (HOSPITAL_COMMUNITY): Payer: Medicare Other

## 2016-04-24 ENCOUNTER — Ambulatory Visit: Payer: Medicare Other | Admitting: Vascular Surgery

## 2016-05-06 DIAGNOSIS — I10 Essential (primary) hypertension: Secondary | ICD-10-CM | POA: Diagnosis not present

## 2016-05-06 DIAGNOSIS — H43313 Vitreous membranes and strands, bilateral: Secondary | ICD-10-CM | POA: Diagnosis not present

## 2016-05-06 DIAGNOSIS — H25813 Combined forms of age-related cataract, bilateral: Secondary | ICD-10-CM | POA: Diagnosis not present

## 2016-05-06 DIAGNOSIS — D3130 Benign neoplasm of unspecified choroid: Secondary | ICD-10-CM | POA: Diagnosis not present

## 2016-05-06 DIAGNOSIS — H43813 Vitreous degeneration, bilateral: Secondary | ICD-10-CM | POA: Diagnosis not present

## 2016-05-06 DIAGNOSIS — H353131 Nonexudative age-related macular degeneration, bilateral, early dry stage: Secondary | ICD-10-CM | POA: Diagnosis not present

## 2016-05-16 DIAGNOSIS — I10 Essential (primary) hypertension: Secondary | ICD-10-CM | POA: Diagnosis not present

## 2016-05-16 DIAGNOSIS — Z780 Asymptomatic menopausal state: Secondary | ICD-10-CM | POA: Diagnosis not present

## 2016-05-19 DIAGNOSIS — I10 Essential (primary) hypertension: Secondary | ICD-10-CM | POA: Diagnosis not present

## 2016-05-19 DIAGNOSIS — R079 Chest pain, unspecified: Secondary | ICD-10-CM | POA: Diagnosis not present

## 2016-05-19 DIAGNOSIS — J431 Panlobular emphysema: Secondary | ICD-10-CM | POA: Diagnosis not present

## 2016-05-19 DIAGNOSIS — E782 Mixed hyperlipidemia: Secondary | ICD-10-CM | POA: Diagnosis not present

## 2016-05-22 DIAGNOSIS — I10 Essential (primary) hypertension: Secondary | ICD-10-CM | POA: Diagnosis not present

## 2016-05-22 DIAGNOSIS — F411 Generalized anxiety disorder: Secondary | ICD-10-CM | POA: Diagnosis not present

## 2016-05-22 DIAGNOSIS — M25511 Pain in right shoulder: Secondary | ICD-10-CM | POA: Diagnosis not present

## 2016-05-22 DIAGNOSIS — G4700 Insomnia, unspecified: Secondary | ICD-10-CM | POA: Diagnosis not present

## 2016-05-22 DIAGNOSIS — J439 Emphysema, unspecified: Secondary | ICD-10-CM | POA: Diagnosis not present

## 2016-05-22 DIAGNOSIS — M810 Age-related osteoporosis without current pathological fracture: Secondary | ICD-10-CM | POA: Diagnosis not present

## 2016-05-22 DIAGNOSIS — E785 Hyperlipidemia, unspecified: Secondary | ICD-10-CM | POA: Diagnosis not present

## 2016-05-28 DIAGNOSIS — R079 Chest pain, unspecified: Secondary | ICD-10-CM | POA: Diagnosis not present

## 2016-05-28 DIAGNOSIS — R0600 Dyspnea, unspecified: Secondary | ICD-10-CM | POA: Diagnosis not present

## 2016-06-02 DIAGNOSIS — I10 Essential (primary) hypertension: Secondary | ICD-10-CM | POA: Diagnosis not present

## 2016-06-02 DIAGNOSIS — E782 Mixed hyperlipidemia: Secondary | ICD-10-CM | POA: Diagnosis not present

## 2016-06-02 DIAGNOSIS — J431 Panlobular emphysema: Secondary | ICD-10-CM | POA: Diagnosis not present

## 2016-06-02 DIAGNOSIS — R079 Chest pain, unspecified: Secondary | ICD-10-CM | POA: Diagnosis not present

## 2016-06-24 DIAGNOSIS — E782 Mixed hyperlipidemia: Secondary | ICD-10-CM | POA: Diagnosis not present

## 2016-06-24 DIAGNOSIS — R079 Chest pain, unspecified: Secondary | ICD-10-CM | POA: Diagnosis not present

## 2016-06-24 DIAGNOSIS — I10 Essential (primary) hypertension: Secondary | ICD-10-CM | POA: Diagnosis not present

## 2016-07-22 DIAGNOSIS — M810 Age-related osteoporosis without current pathological fracture: Secondary | ICD-10-CM | POA: Diagnosis not present

## 2016-07-22 DIAGNOSIS — G4700 Insomnia, unspecified: Secondary | ICD-10-CM | POA: Diagnosis not present

## 2016-07-22 DIAGNOSIS — E785 Hyperlipidemia, unspecified: Secondary | ICD-10-CM | POA: Diagnosis not present

## 2016-07-22 DIAGNOSIS — J439 Emphysema, unspecified: Secondary | ICD-10-CM | POA: Diagnosis not present

## 2016-07-22 DIAGNOSIS — I10 Essential (primary) hypertension: Secondary | ICD-10-CM | POA: Diagnosis not present

## 2016-07-22 DIAGNOSIS — F411 Generalized anxiety disorder: Secondary | ICD-10-CM | POA: Diagnosis not present

## 2016-07-22 DIAGNOSIS — R238 Other skin changes: Secondary | ICD-10-CM | POA: Diagnosis not present

## 2016-09-06 DIAGNOSIS — J069 Acute upper respiratory infection, unspecified: Secondary | ICD-10-CM | POA: Diagnosis not present

## 2016-09-15 DIAGNOSIS — I10 Essential (primary) hypertension: Secondary | ICD-10-CM | POA: Diagnosis not present

## 2016-09-15 DIAGNOSIS — E782 Mixed hyperlipidemia: Secondary | ICD-10-CM | POA: Diagnosis not present

## 2016-09-15 DIAGNOSIS — J431 Panlobular emphysema: Secondary | ICD-10-CM | POA: Diagnosis not present

## 2016-09-19 DIAGNOSIS — G4700 Insomnia, unspecified: Secondary | ICD-10-CM | POA: Diagnosis not present

## 2016-09-19 DIAGNOSIS — M810 Age-related osteoporosis without current pathological fracture: Secondary | ICD-10-CM | POA: Diagnosis not present

## 2016-09-19 DIAGNOSIS — I10 Essential (primary) hypertension: Secondary | ICD-10-CM | POA: Diagnosis not present

## 2016-09-19 DIAGNOSIS — Z79899 Other long term (current) drug therapy: Secondary | ICD-10-CM | POA: Diagnosis not present

## 2017-11-04 DIAGNOSIS — I10 Essential (primary) hypertension: Secondary | ICD-10-CM | POA: Diagnosis not present

## 2017-11-04 DIAGNOSIS — J431 Panlobular emphysema: Secondary | ICD-10-CM | POA: Diagnosis not present

## 2017-11-04 DIAGNOSIS — E782 Mixed hyperlipidemia: Secondary | ICD-10-CM | POA: Diagnosis not present

## 2017-11-09 DIAGNOSIS — H3552 Pigmentary retinal dystrophy: Secondary | ICD-10-CM | POA: Diagnosis not present

## 2017-11-09 DIAGNOSIS — H524 Presbyopia: Secondary | ICD-10-CM | POA: Diagnosis not present

## 2017-11-09 DIAGNOSIS — D3130 Benign neoplasm of unspecified choroid: Secondary | ICD-10-CM | POA: Diagnosis not present

## 2017-11-09 DIAGNOSIS — H35373 Puckering of macula, bilateral: Secondary | ICD-10-CM | POA: Diagnosis not present

## 2017-11-09 DIAGNOSIS — H35363 Drusen (degenerative) of macula, bilateral: Secondary | ICD-10-CM | POA: Diagnosis not present

## 2017-11-09 DIAGNOSIS — H25093 Other age-related incipient cataract, bilateral: Secondary | ICD-10-CM | POA: Diagnosis not present

## 2017-11-09 DIAGNOSIS — H52223 Regular astigmatism, bilateral: Secondary | ICD-10-CM | POA: Diagnosis not present

## 2017-11-09 DIAGNOSIS — H5203 Hypermetropia, bilateral: Secondary | ICD-10-CM | POA: Diagnosis not present

## 2017-11-27 DIAGNOSIS — R05 Cough: Secondary | ICD-10-CM | POA: Diagnosis not present

## 2017-11-27 DIAGNOSIS — J069 Acute upper respiratory infection, unspecified: Secondary | ICD-10-CM | POA: Diagnosis not present

## 2017-11-27 DIAGNOSIS — R070 Pain in throat: Secondary | ICD-10-CM | POA: Diagnosis not present

## 2018-01-27 DIAGNOSIS — G4700 Insomnia, unspecified: Secondary | ICD-10-CM | POA: Diagnosis not present

## 2018-01-27 DIAGNOSIS — I10 Essential (primary) hypertension: Secondary | ICD-10-CM | POA: Diagnosis not present

## 2018-01-27 DIAGNOSIS — M810 Age-related osteoporosis without current pathological fracture: Secondary | ICD-10-CM | POA: Diagnosis not present

## 2018-01-27 DIAGNOSIS — E785 Hyperlipidemia, unspecified: Secondary | ICD-10-CM | POA: Diagnosis not present

## 2018-01-27 DIAGNOSIS — J439 Emphysema, unspecified: Secondary | ICD-10-CM | POA: Diagnosis not present

## 2018-04-01 DIAGNOSIS — M810 Age-related osteoporosis without current pathological fracture: Secondary | ICD-10-CM | POA: Diagnosis not present

## 2018-04-21 DIAGNOSIS — E559 Vitamin D deficiency, unspecified: Secondary | ICD-10-CM | POA: Diagnosis not present

## 2018-04-21 DIAGNOSIS — I10 Essential (primary) hypertension: Secondary | ICD-10-CM | POA: Diagnosis not present

## 2018-04-21 DIAGNOSIS — Z0000 Encounter for general adult medical examination without abnormal findings: Secondary | ICD-10-CM | POA: Diagnosis not present

## 2018-04-21 DIAGNOSIS — J439 Emphysema, unspecified: Secondary | ICD-10-CM | POA: Diagnosis not present

## 2018-04-21 DIAGNOSIS — G4700 Insomnia, unspecified: Secondary | ICD-10-CM | POA: Diagnosis not present

## 2018-04-21 DIAGNOSIS — Z79899 Other long term (current) drug therapy: Secondary | ICD-10-CM | POA: Diagnosis not present

## 2018-04-21 DIAGNOSIS — R350 Frequency of micturition: Secondary | ICD-10-CM | POA: Diagnosis not present

## 2018-04-21 DIAGNOSIS — M810 Age-related osteoporosis without current pathological fracture: Secondary | ICD-10-CM | POA: Diagnosis not present

## 2018-04-21 DIAGNOSIS — E785 Hyperlipidemia, unspecified: Secondary | ICD-10-CM | POA: Diagnosis not present

## 2018-04-28 DIAGNOSIS — M810 Age-related osteoporosis without current pathological fracture: Secondary | ICD-10-CM | POA: Diagnosis not present

## 2018-05-10 DIAGNOSIS — Z9181 History of falling: Secondary | ICD-10-CM | POA: Diagnosis not present

## 2018-05-10 DIAGNOSIS — G4700 Insomnia, unspecified: Secondary | ICD-10-CM | POA: Diagnosis not present

## 2018-05-10 DIAGNOSIS — Z6823 Body mass index (BMI) 23.0-23.9, adult: Secondary | ICD-10-CM | POA: Diagnosis not present

## 2018-05-10 DIAGNOSIS — I10 Essential (primary) hypertension: Secondary | ICD-10-CM | POA: Diagnosis not present

## 2018-07-02 DIAGNOSIS — L03113 Cellulitis of right upper limb: Secondary | ICD-10-CM | POA: Diagnosis not present

## 2018-07-02 DIAGNOSIS — T7840XA Allergy, unspecified, initial encounter: Secondary | ICD-10-CM | POA: Diagnosis not present

## 2019-04-04 ENCOUNTER — Other Ambulatory Visit: Payer: Self-pay

## 2019-04-04 ENCOUNTER — Encounter: Payer: Self-pay | Admitting: Emergency Medicine

## 2019-04-04 ENCOUNTER — Ambulatory Visit (INDEPENDENT_AMBULATORY_CARE_PROVIDER_SITE_OTHER): Payer: Medicare Other | Admitting: Emergency Medicine

## 2019-04-04 DIAGNOSIS — R911 Solitary pulmonary nodule: Secondary | ICD-10-CM

## 2019-04-04 DIAGNOSIS — R918 Other nonspecific abnormal finding of lung field: Secondary | ICD-10-CM | POA: Diagnosis not present

## 2019-04-04 DIAGNOSIS — J449 Chronic obstructive pulmonary disease, unspecified: Secondary | ICD-10-CM

## 2019-04-04 NOTE — Assessment & Plan Note (Signed)
Continue spiriva for now. She may benefit going forward from addition LABA.

## 2019-04-04 NOTE — Patient Instructions (Signed)
We will arrange for pulmonary function testing We will arrange for PET scan Tele-visit with Dr Lamonte Sakai or Gladstone Pih in ~10 days to discuss results and plan a strategy for tissue diagnosis  Please continue your Spiriva daily

## 2019-04-04 NOTE — Assessment & Plan Note (Signed)
LUL mass found spuriously when she was being evaluated for chest discomfort, likely musculoskeletal. Suspect primary lung CA. She has other nodules of unclear significance, mediastinal LAD. I believe she needs PET to determine the best bx target, may be distant disease outside chest. Also need PFt to insure Ok to proceed with FOB under general anesthesia. I will help arrange best diagnostic procedure once we see these tests.

## 2019-04-04 NOTE — Progress Notes (Signed)
Subjective:    Patient ID: Sylvia Suarez, female    DOB: 10/19/1935, 83 y.o.   MRN: [id number]  HPI 83 year old former smoker (120 pack years), history of hypertension, hyperlipidemia, macular degeneration.  She carries a history of COPD, looks like she may have had PFT in 2017 at [place] but I do not have these available. She is on Spiriva.   She is referred for abnormal CT scan of the chest which was done at [facility] 03/24/2019.  I have reviewed.  This shows some mediastinal lymphadenopathy, a 4.1 x 3.1 x 2.4 cm left upper lobe mass, a few tiny nonspecific pulmonary nodules all less than 4 mm some with calcification, two 6 mm right upper lobe groundglass nodular foci of unclear significance, anterior left upper lobe 7 mm nodule of unclear significance.  She had been well, developed some bandlike chest pain, possibly due to lifting a heavy object. The eval included CXR and then CT chest. She still has the CP. She has exertional SOB, can walk 50-75 ft. Does not have / use SABA. Compliant with Spiriva   Review of Systems  Constitutional: Negative for fever and unexpected weight change.  HENT: Negative for congestion, dental problem, ear pain, nosebleeds, postnasal drip, rhinorrhea, sinus pressure, sneezing, sore throat and trouble swallowing.   Eyes: Negative for redness and itching.  Respiratory: Negative for cough, chest tightness, shortness of breath and wheezing.   Cardiovascular: Negative for palpitations and leg swelling.  Gastrointestinal: Negative for nausea and vomiting.  Genitourinary: Negative for dysuria.  Musculoskeletal: Negative for joint swelling.  Skin: Negative for rash.  Neurological: Negative for headaches.  Hematological: Does not bruise/bleed easily.  Psychiatric/Behavioral: Negative for dysphoric mood. The patient is not nervous/anxious.    Past Medical History:  Diagnosis Date  . COPD (chronic obstructive pulmonary disease) ([place])   . Grief   . Hyperlipidemia    . Hypertension   . Insomnia   . Leg pain   . Macular degeneration of right eye   . Macular degeneration, left eye   . Osteoporosis   . Varicose veins      Family History  Problem Relation Age of Onset  . Stroke Mother   . Hyperlipidemia Mother   . Arthritis Mother   . Hypertension Mother   . Diabetes Mother   . Varicose Veins Mother   . Arthritis Sister   . Hypertension Sister   . Diabetes Sister   . Heart disease Sister   . Arthritis Brother   . Hypertension Brother   . Heart disease Brother      Social History   Socioeconomic History  . Marital status: Married    Spouse name: Not on file  . Number of children: Not on file  . Years of education: Not on file  . Highest education level: Not on file  Occupational History  . Not on file  Social Needs  . Financial resource strain: Not on file  . Food insecurity    Worry: Not on file    Inability: Not on file  . Transportation needs    Medical: Not on file    Non-medical: Not on file  Tobacco Use  . Smoking status: Former Smoker    Packs/day: 3.00    Years: 40.00    Pack years: 120.00    Quit date: 10/07/1995    Years since quitting: 23.5  . Smokeless tobacco: Never Used  Substance and Sexual Activity  . Alcohol use: Yes    Alcohol/week: 2.0  standard drinks    Types: 2 Shots of liquor per week    Comment: twice weekly  . Drug use: No  . Sexual activity: Not on file  Lifestyle  . Physical activity    Days per week: Not on file    Minutes per session: Not on file  . Stress: Not on file  Relationships  . Social Herbalist on phone: Not on file    Gets together: Not on file    Attends religious service: Not on file    Active member of club or organization: Not on file    Attends meetings of clubs or organizations: Not on file    Relationship status: Not on file  . Intimate partner violence    Fear of current or ex partner: Not on file    Emotionally abused: Not on file    Physically abused:  Not on file    Forced sexual activity: Not on file  Other Topics Concern  . Not on file  Social History Narrative  . Not on file     Allergies  Allergen Reactions  . Venomil Wasp Venom [Wasp Venom] Anaphylaxis  . Ace Inhibitors Cough  . Influenza Virus Vaccine Split     Patient states she is allergic to flu shot  . Lipitor [Atorvastatin]     aches  . Oxycodone Hcl     Tongue swelling   . Venomil Yellow Jacket Venom [White Faced Hornet Venom]     Swollen lips, tongue swelling   . Zetia [Ezetimibe]     Muscle aches  . Keflex [Cephalexin] Rash    Swollen lips, tongue swelling  . Penicillins Rash    Swollen lips, tongue swelling      Outpatient Medications Prior to Visit  Medication Sig Dispense Refill  . Calcium Carbonate-Vitamin D (CALTRATE 600+D) 600-400 MG-UNIT tablet Take 1 tablet by mouth daily.    . cloNIDine (CATAPRES) 0.1 MG tablet Take 0.1 mg by mouth 2 (two) times daily.    . metoprolol succinate (TOPROL-XL) 50 MG 24 hr tablet Take 50 mg by mouth daily. Take with or immediately following a meal.    . oxycodone (OXY-IR) 5 MG capsule Take 5 mg by mouth every 6 (six) hours as needed.    . rosuvastatin (CRESTOR) 10 MG tablet Take 10 mg by mouth daily.    Marland Kitchen tiotropium (SPIRIVA) 18 MCG inhalation capsule Place 18 mcg into inhaler and inhale daily.    Marland Kitchen zolpidem (AMBIEN) 5 MG tablet Take 5 mg by mouth at bedtime as needed for sleep.    Marland Kitchen albuterol (PROAIR HFA) 108 (90 BASE) MCG/ACT inhaler Inhale into the lungs every 6 (six) hours as needed for wheezing or shortness of breath.    Marland Kitchen alendronate (FOSAMAX) 70 MG tablet Take 70 mg by mouth once a week. Take with a full glass of water on an empty stomach.    . ALPRAZolam (XANAX) 0.5 MG tablet Take 0.5 mg by mouth as needed for anxiety.    Marland Kitchen diltiazem (CARDIZEM CD) 180 MG 24 hr capsule Take 180 mg by mouth daily.    . Multiple Vitamins-Minerals (PRESERVISION/LUTEIN PO) Take by mouth daily.    . Omega-3 Fatty Acids (FISH OIL)  1000 MG CAPS Take by mouth daily. TAKES 2 TABLETS DAILY.     No facility-administered medications prior to visit.         Objective:   Physical Exam Vitals:   04/04/19 1506  BP: 128/86  Pulse: 88  SpO2: 96%   Gen: Pleasant, elderly woman, in no distress,  normal affect  ENT: No lesions,  mouth clear,  oropharynx clear, no postnasal drip  Neck: No JVD, no stridor  Lungs: No use of accessory muscles, no crackles or wheezing on normal respiration, no wheeze on forced expiration  Cardiovascular: RRR, heart sounds normal, no murmur or gallops, no peripheral edema  Musculoskeletal: No deformities, no cyanosis or clubbing. Tender to palp both costal margins.   Neuro: alert, awake, non focal  Skin: Warm, no lesions or rash      Assessment & Plan:  Mass of left lung LUL mass found spuriously when she was being evaluated for chest discomfort, likely musculoskeletal. Suspect primary lung CA. She has other nodules of unclear significance, mediastinal LAD. I believe she needs PET to determine the best bx target, may be distant disease outside chest. Also need PFt to insure Ok to proceed with FOB under general anesthesia. I will help arrange best diagnostic procedure once we see these tests.   COPD (chronic obstructive pulmonary disease) ([place]) Continue spiriva for now. She may benefit going forward from addition LABA.   Baltazar Apo, MD, PhD 04/04/2019, 3:46 PM [facility] [phone] or if no answer [phone]

## 2019-04-07 ENCOUNTER — Telehealth: Payer: Self-pay | Admitting: Emergency Medicine

## 2019-04-07 NOTE — Telephone Encounter (Signed)
Pt's daughter -in -law [employer] is calling back [phone]

## 2019-04-07 NOTE — Telephone Encounter (Signed)
Left message for patient to call back. DPR is not scanned in for patient and she is not listed on the emergency contacts.

## 2019-04-10 ENCOUNTER — Encounter (HOSPITAL_COMMUNITY): Payer: Self-pay | Admitting: Emergency Medicine

## 2019-04-10 ENCOUNTER — Observation Stay (HOSPITAL_COMMUNITY)
Admission: EM | Admit: 2019-04-10 | Discharge: 2019-04-12 | Disposition: A | Discharge status: Home / Self Care | Payer: Medicare Other | Attending: Internal Medicine | Admitting: Internal Medicine

## 2019-04-10 ENCOUNTER — Other Ambulatory Visit: Payer: Self-pay

## 2019-04-10 ENCOUNTER — Emergency Department (HOSPITAL_COMMUNITY): Payer: Medicare Other

## 2019-04-10 DIAGNOSIS — I8393 Asymptomatic varicose veins of bilateral lower extremities: Secondary | ICD-10-CM | POA: Insufficient documentation

## 2019-04-10 DIAGNOSIS — E785 Hyperlipidemia, unspecified: Secondary | ICD-10-CM | POA: Insufficient documentation

## 2019-04-10 DIAGNOSIS — M4854XA Collapsed vertebra, not elsewhere classified, thoracic region, initial encounter for fracture: Principal | ICD-10-CM | POA: Insufficient documentation

## 2019-04-10 DIAGNOSIS — M545 Low back pain: Secondary | ICD-10-CM | POA: Diagnosis not present

## 2019-04-10 DIAGNOSIS — C7951 Secondary malignant neoplasm of bone: Secondary | ICD-10-CM | POA: Insufficient documentation

## 2019-04-10 DIAGNOSIS — Z1159 Encounter for screening for other viral diseases: Secondary | ICD-10-CM | POA: Insufficient documentation

## 2019-04-10 DIAGNOSIS — Z66 Do not resuscitate: Secondary | ICD-10-CM | POA: Diagnosis not present

## 2019-04-10 DIAGNOSIS — Z79899 Other long term (current) drug therapy: Secondary | ICD-10-CM | POA: Insufficient documentation

## 2019-04-10 DIAGNOSIS — S22000A Wedge compression fracture of unspecified thoracic vertebra, initial encounter for closed fracture: Secondary | ICD-10-CM

## 2019-04-10 DIAGNOSIS — J449 Chronic obstructive pulmonary disease, unspecified: Secondary | ICD-10-CM | POA: Insufficient documentation

## 2019-04-10 DIAGNOSIS — Z87891 Personal history of nicotine dependence: Secondary | ICD-10-CM | POA: Diagnosis not present

## 2019-04-10 DIAGNOSIS — R590 Localized enlarged lymph nodes: Secondary | ICD-10-CM | POA: Diagnosis not present

## 2019-04-10 DIAGNOSIS — G4700 Insomnia, unspecified: Secondary | ICD-10-CM | POA: Diagnosis not present

## 2019-04-10 DIAGNOSIS — C799 Secondary malignant neoplasm of unspecified site: Secondary | ICD-10-CM | POA: Diagnosis present

## 2019-04-10 DIAGNOSIS — M47814 Spondylosis without myelopathy or radiculopathy, thoracic region: Secondary | ICD-10-CM | POA: Diagnosis not present

## 2019-04-10 DIAGNOSIS — J90 Pleural effusion, not elsewhere classified: Secondary | ICD-10-CM | POA: Insufficient documentation

## 2019-04-10 DIAGNOSIS — C801 Malignant (primary) neoplasm, unspecified: Secondary | ICD-10-CM | POA: Insufficient documentation

## 2019-04-10 DIAGNOSIS — I10 Essential (primary) hypertension: Secondary | ICD-10-CM | POA: Diagnosis not present

## 2019-04-10 DIAGNOSIS — M549 Dorsalgia, unspecified: Secondary | ICD-10-CM | POA: Diagnosis present

## 2019-04-10 LAB — BASIC METABOLIC PANEL
Anion gap: 11 (ref 5–15)
BUN: 13 mg/dL (ref 8–23)
CO2: 26 mmol/L (ref 22–32)
Calcium: 9 mg/dL (ref 8.9–10.3)
Chloride: 98 mmol/L (ref 98–111)
Creatinine, Ser: 0.66 mg/dL (ref 0.44–1.00)
GFR calc Af Amer: >60 mL/min (ref >60)
GFR calc non Af Amer: >60 mL/min (ref >60)
Glucose, Bld: 89 mg/dL (ref 70–99)
Potassium: 4 mmol/L (ref 3.5–5.1)
Sodium: 135 mmol/L (ref 135–145)

## 2019-04-10 LAB — SARS CORONAVIRUS 2 BY RT PCR (HOSPITAL ORDER, PERFORMED IN ~~LOC~~ HOSPITAL LAB): SARS Coronavirus 2: NEGATIVE

## 2019-04-10 LAB — CBC
HCT: 40.5 % (ref 36.0–46.0)
Hemoglobin: 13.1 g/dL (ref 12.0–15.0)
MCH: 28.9 pg (ref 26.0–34.0)
MCHC: 32.3 g/dL (ref 30.0–36.0)
MCV: 89.2 fL (ref 80.0–100.0)
Platelets: 275 10*3/uL (ref 150–400)
RBC: 4.54 MIL/uL (ref 3.87–5.11)
RDW: 12.4 % (ref 11.5–15.5)
WBC: 10 10*3/uL (ref 4.0–10.5)
nRBC: 0 % (ref 0.0–0.2)

## 2019-04-10 MED ORDER — TIOTROPIUM BROMIDE MONOHYDRATE 18 MCG IN CAPS: 18.0000 ug | ORAL_CAPSULE | Freq: Every day | RESPIRATORY_TRACT | Status: DC

## 2019-04-10 MED ORDER — ZOLPIDEM TARTRATE 5 MG PO TABS
5.0000 mg | ORAL_TABLET | Freq: Every evening | ORAL | Status: DC | PRN
  Administered 2019-04-10 – 2019-04-11 (×2): 5 mg via ORAL
  Filled 2019-04-10 (×2): qty 1

## 2019-04-10 MED ORDER — ROSUVASTATIN CALCIUM 20 MG PO TABS
20.0000 mg | ORAL_TABLET | Freq: Every day | ORAL | Status: DC
  Administered 2019-04-11: 20 mg via ORAL
  Filled 2019-04-10 (×2): qty 1

## 2019-04-10 MED ORDER — HYDROMORPHONE HCL 1 MG/ML IJ SOLN
0.5000 mg | INTRAMUSCULAR | Status: DC | PRN
  Administered 2019-04-10: 0.5 mg via INTRAVENOUS
  Filled 2019-04-10: qty 0.5

## 2019-04-10 MED ORDER — CLONIDINE HCL 0.1 MG PO TABS
0.1000 mg | ORAL_TABLET | Freq: Two times a day (BID) | ORAL | Status: DC
  Administered 2019-04-10 – 2019-04-11 (×3): 0.1 mg via ORAL
  Filled 2019-04-10 (×3): qty 1

## 2019-04-10 MED ORDER — HYDROMORPHONE HCL 1 MG/ML IJ SOLN
0.5000 mg | Freq: Once | INTRAMUSCULAR | Status: AC
  Administered 2019-04-10: 0.5 mg via INTRAVENOUS
  Filled 2019-04-10: qty 1

## 2019-04-10 MED ORDER — ENOXAPARIN SODIUM 40 MG/0.4ML ~~LOC~~ SOLN
40.0000 mg | Freq: Every day | SUBCUTANEOUS | Status: DC
  Administered 2019-04-10 – 2019-04-11 (×2): 40 mg via SUBCUTANEOUS
  Filled 2019-04-10 (×2): qty 0.4

## 2019-04-10 MED ORDER — LIDOCAINE 5 % EX PTCH
1.0000 | MEDICATED_PATCH | Freq: Every day | CUTANEOUS | Status: DC
  Administered 2019-04-10 – 2019-04-11 (×2): 1 via TRANSDERMAL
  Filled 2019-04-10 (×2): qty 1

## 2019-04-10 MED ORDER — METOPROLOL SUCCINATE ER 50 MG PO TB24
50.0000 mg | ORAL_TABLET | Freq: Every day | ORAL | Status: DC
  Administered 2019-04-11: 50 mg via ORAL
  Filled 2019-04-10 (×2): qty 1

## 2019-04-10 MED ORDER — ENSURE ENLIVE PO LIQD
237.0000 mL | Freq: Two times a day (BID) | ORAL | Status: DC
  Administered 2019-04-11 (×2): 237 mL via ORAL

## 2019-04-10 MED ORDER — UMECLIDINIUM BROMIDE 62.5 MCG/INH IN AEPB
1.0000 | INHALATION_SPRAY | Freq: Every day | RESPIRATORY_TRACT | Status: DC
  Administered 2019-04-12: 1 via RESPIRATORY_TRACT
  Filled 2019-04-10 (×2): qty 7

## 2019-04-10 MED ORDER — OXYCODONE HCL ER 15 MG PO T12A
15.0000 mg | EXTENDED_RELEASE_TABLET | Freq: Two times a day (BID) | ORAL | Status: DC
  Administered 2019-04-10 – 2019-04-11 (×3): 15 mg via ORAL
  Filled 2019-04-10 (×3): qty 1

## 2019-04-10 MED ORDER — CALCIUM CARBONATE-VITAMIN D 500-200 MG-UNIT PO TABS
1.0000 | ORAL_TABLET | Freq: Every day | ORAL | Status: DC
  Administered 2019-04-11: 1 via ORAL
  Filled 2019-04-10 (×2): qty 1

## 2019-04-10 MED ORDER — SODIUM CHLORIDE 0.9 % IV SOLN
INTRAVENOUS | Status: DC
  Administered 2019-04-10: 23:00:00 via INTRAVENOUS

## 2019-04-10 NOTE — ED Notes (Signed)
ED TO INPATIENT HANDOFF REPORT  ED Nurse Name and Phone #: Christinia Gully Name/Age/Gender Sylvia Suarez 83 y.o. female Room/Bed: WA08/WA08  Code Status   Code Status: Not on file  Home/SNF/Other given to floor Patient oriented to: self, place, time and situation Is this baseline? Yes   Triage Complete: Triage complete  Chief Complaint Cancer Patient / Chest and Back Pain   Triage Note Patient reports newly diagnosed lung cancer mets to lymph nodes and spine. Reports running out of oxycodone and having continued pain.    Allergies Allergies  Allergen Reactions  . Venomil Wasp Venom [Wasp Venom] Anaphylaxis  . Ace Inhibitors Cough  . Influenza Virus Vaccine Split     Patient states she is allergic to flu shot  . Lipitor [Atorvastatin]     aches  . Oxycodone Hcl     Tongue swelling   . Venomil Yellow Jacket Venom [White Faced Hornet Venom]     Swollen lips, tongue swelling   . Zetia [Ezetimibe]     Muscle aches  . Keflex [Cephalexin] Rash    Swollen lips, tongue swelling  . Penicillins Rash    Swollen lips, tongue swelling     Level of Care/Admitting Diagnosis ED Disposition    ED Disposition Condition Comment   Admit  Hospital Area: [facility] [[id number]]  Level of Care: Med-Surg [16]  Covid Evaluation: Confirmed COVID Negative  Diagnosis: Back pain [[id number]]  Admitting Physician: Bonnell Public [[id number]]  Attending Physician: Dana Allan I [[id number]]  PT Class (Do Not Modify): Observation [104]  PT Acc Code (Do Not Modify): Observation [10022]       B Medical/Surgery History Past Medical History:  Diagnosis Date  . COPD (chronic obstructive pulmonary disease) ([place])   . Grief   . Hyperlipidemia   . Hypertension   . Insomnia   . Leg pain   . Macular degeneration of right eye   . Macular degeneration, left eye   . Osteoporosis   . Varicose veins    Past Surgical History:  Procedure Laterality Date  . APPENDECTOMY   1975  . repair of elbow fracture  2013, revised 01-2014     A IV Location/Drains/Wounds Patient Lines/Drains/Airways Status   Active Line/Drains/Airways    Name:   Placement date:   Placement time:   Site:   Days:   Peripheral IV 04/10/19 Left Forearm   04/10/19    1804    Forearm   less than 1          Intake/Output Last 24 hours No intake or output data in the 24 hours ending 04/10/19 2110  Labs/Imaging Results for orders placed or performed during the hospital encounter of 04/10/19 (from the past 48 hour(s))  Basic metabolic panel     Status: None   Collection Time: 04/10/19  4:44 PM  Result Value Ref Range   Sodium 135 135 - 145 mmol/L   Potassium 4.0 3.5 - 5.1 mmol/L   Chloride 98 98 - 111 mmol/L   CO2 26 22 - 32 mmol/L   Glucose, Bld 89 70 - 99 mg/dL   BUN 13 8 - 23 mg/dL   Creatinine, Ser 0.66 0.44 - 1.00 mg/dL   Calcium 9.0 8.9 - 10.3 mg/dL   GFR calc non Af Amer >60 >60 mL/min   GFR calc Af Amer >60 >60 mL/min   Anion gap 11 5 - 15    Comment: Performed at [facility], [place]  [address] [place], [place] [address]  CBC     Status: None   Collection Time: 04/10/19  4:44 PM  Result Value Ref Range   WBC 10.0 4.0 - 10.5 K/uL   RBC 4.54 3.87 - 5.11 MIL/uL   Hemoglobin 13.1 12.0 - 15.0 g/dL   HCT 40.5 36.0 - 46.0 %   MCV 89.2 80.0 - 100.0 fL   MCH 28.9 26.0 - 34.0 pg   MCHC 32.3 30.0 - 36.0 g/dL   RDW 12.4 11.5 - 15.5 %   Platelets 275 150 - 400 K/uL   nRBC 0.0 0.0 - 0.2 %    Comment: Performed at [facility], [place] [address], [place], [place] [address]  SARS Coronavirus 2 (CEPHEID - Performed in [place] hospital lab), Hosp Order     Status: None   Collection Time: 04/10/19  4:44 PM   Specimen: Nasopharyngeal Swab  Result Value Ref Range   SARS Coronavirus 2 NEGATIVE NEGATIVE    Comment: (NOTE) If result is NEGATIVE SARS-CoV-2 target nucleic acids are NOT DETECTED. The SARS-CoV-2 RNA is generally detectable in upper  and lower  respiratory specimens during the acute phase of infection. The lowest  concentration of SARS-CoV-2 viral copies this assay can detect is 250  copies / mL. A negative result does not preclude SARS-CoV-2 infection  and should not be used as the sole basis for treatment or other  patient management decisions.  A negative result may occur with  improper specimen collection / handling, submission of specimen other  than nasopharyngeal swab, presence of viral mutation(s) within the  areas targeted by this assay, and inadequate number of viral copies  (<250 copies / mL). A negative result must be combined with clinical  observations, patient history, and epidemiological information. If result is POSITIVE SARS-CoV-2 target nucleic acids are DETECTED. The SARS-CoV-2 RNA is generally detectable in upper and lower  respiratory specimens dur ing the acute phase of infection.  Positive  results are indicative of active infection with SARS-CoV-2.  Clinical  correlation with patient history and other diagnostic information is  necessary to determine patient infection status.  Positive results do  not rule out bacterial infection or co-infection with other viruses. If result is PRESUMPTIVE POSTIVE SARS-CoV-2 nucleic acids MAY BE PRESENT.   A presumptive positive result was obtained on the submitted specimen  and confirmed on repeat testing.  While 2019 novel coronavirus  (SARS-CoV-2) nucleic acids may be present in the submitted sample  additional confirmatory testing may be necessary for epidemiological  and / or clinical management purposes  to differentiate between  SARS-CoV-2 and other Sarbecovirus currently known to infect humans.  If clinically indicated additional testing with an alternate test  methodology [phone]) is advised. The SARS-CoV-2 RNA is generally  detectable in upper and lower respiratory sp ecimens during the acute  phase of infection. The expected result is  Negative. Fact Sheet for Patients:  StrictlyIdeas.no Fact Sheet for Healthcare Providers: BankingDealers.co.za This test is not yet approved or cleared by the [place] FDA and has been authorized for detection and/or diagnosis of SARS-CoV-2 by FDA under an Emergency Use Authorization (EUA).  This EUA will remain in effect (meaning this test can be used) for the duration of the COVID-19 declaration under Section 564(b)(1) of the Act, 21 U.S.C. section 360bbb-3(b)(1), unless the authorization is terminated or revoked sooner. Performed at [facility], [place] [address] [place], [place] [address]    Dg Thoracic Spine 2  View  Result Date: 04/10/2019 CLINICAL DATA:  Worsening thoracic back pain. EXAM: THORACIC SPINE 2 VIEWS COMPARISON:  04/09/2019 chest CT. FINDINGS: Mild T6 vertebral compression fracture and mild T12 vertebral compression fracture are stable compared to the chest CT study of 1 day prior. Previously described T9 vertebral compression fracture is occult by radiographs. No new thoracic fracture. No subluxation. Mild thoracic spondylosis. No suspicious focal osseous lesions appreciated. IMPRESSION: Mild T6 and T12 vertebral compression fractures are stable compared to chest CT of 1 day prior. Previously described T9 vertebral compression fractures radiographically occult. No new thoracic fracture. No subluxation. Mild thoracic spondylosis. Electronically Signed   By: Ilona Sorrel M.D.   On: 04/10/2019 17:23    Pending Labs [employer] (From admission, onward)    Start     Ordered   Signed and Held  CBC  (enoxaparin (LOVENOX)    CrCl >/= 30 ml/min)  Once,   R    Comments: Baseline for enoxaparin therapy IF NOT ALREADY DRAWN.  Notify MD if PLT < 100 K.    Signed and Held   Signed and Held  Creatinine, serum  (enoxaparin (LOVENOX)    CrCl >/= 30 ml/min)  Once,   R    Comments: Baseline for enoxaparin therapy IF  NOT ALREADY DRAWN.    Signed and Held   Signed and Held  Creatinine, serum  (enoxaparin (LOVENOX)    CrCl >/= 30 ml/min)  Weekly,   R    Comments: while on enoxaparin therapy    Signed and Held   Signed and Held  Basic metabolic panel  Tomorrow morning,   R     Signed and Held   Signed and Held  CBC  Tomorrow morning,   R     Signed and Held          Vitals/Pain Today's Vitals   04/10/19 1529 04/10/19 1530 04/10/19 1907  BP: (!) 134/92  129/72  Pulse: 88  88  Resp: 18  16  Temp: 98.1 F (36.7 C)    TempSrc: Oral    SpO2: 96%  96%  PainSc:  10-Worst pain ever     Isolation Precautions No active isolations  Medications Medications  HYDROmorphone (DILAUDID) injection 0.5 mg (0.5 mg Intravenous Given 04/10/19 1725)  HYDROmorphone (DILAUDID) injection 0.5 mg (0.5 mg Intravenous Given 04/10/19 1901)    Mobility walks Low fall risk   Focused Assessments Pain   R Recommendations: See Admitting Provider Note  Report given to:   Additional Notes:

## 2019-04-10 NOTE — H&P (Signed)
History and Physical  Sylvia Suarez QIO:[id number] DOB: [birth year] DOA: 04/10/2019  Referring physician: ER provider PCP: Janine Limbo, PA-C  Outpatient Specialists:    Patient coming from: Home  Chief Complaint: Back pain  HPI:  Patient is an 83 year old female with past medical history significant for hypertension, hyperlipidemia, COPD and reported metastatic malignancy of unknown primary.  Patient presents with uncontrolled back pain.  According to the patient, back pain started about 3 weeks ago when she tried to lift a pot of flower.  Patient has had back pain since then but the back pain became severe today leading to presentation to the hospital.  Prior imaging and current imaging studies reveal compression fractures.  No headache, no neck pain, no fever or chills, no chest pain, no shortness of breath, no URI symptoms and no GI symptoms.  Patient is also known to the pulmonary team locally and is currently being worked up for likely metastatic cancer of unknown primary, but the lung is currently suspected as the primary source.  Pulmonary team is considering further management on this admission.  Hospitalist team has been called to admit patient for further assessment and management.  ED Course: On presentation to the hospital, pressure was 98.1, blood pressure of 129/70, heart rate of 88, respiratory rate of 16 and O2 sat of 97% on room air.  Patient's pain is been currently controlled with IV opiates.  Pertinent labs: Chemistry reveals sodium of 135, potassium of 4, chloride 98, CO2 26, BUN of 13, creatinine of 0.66 and blood sugar of 89.  CBC reveals WBC of 10, hemoglobin of 13.0, hematocrit of 40.5, MCV of 89.2 platelet count of 275.  Repeat COVID test is negative.  Thoracic spine x-ray reveals "Mild T6 and T12 vertebral compression fractures are stable compared to chest CT of 1 day prior. Previously described T9 vertebral compression fractures radiographically occult. No new thoracic  fracture. No subluxation. Mild thoracic spondylosis ".  Imaging: independently reviewed.   Review of Systems:  Negative for fever, visual changes, sore throat, rash, new muscle aches, chest pain, SOB, dysuria, bleeding, n/v/abdominal pain.  Past Medical History:  Diagnosis Date  . COPD (chronic obstructive pulmonary disease) ([place])   . Grief   . Hyperlipidemia   . Hypertension   . Insomnia   . Leg pain   . Macular degeneration of right eye   . Macular degeneration, left eye   . Osteoporosis   . Varicose veins     Past Surgical History:  Procedure Laterality Date  . APPENDECTOMY  1975  . repair of elbow fracture  2013, revised 01-2014     reports that she quit smoking about 23 years ago. She has a 120.00 pack-year smoking history. She has never used smokeless tobacco. She reports current alcohol use of about 2.0 standard drinks of alcohol per week. She reports that she does not use drugs.  Allergies  Allergen Reactions  . Venomil Wasp Venom [Wasp Venom] Anaphylaxis  . Ace Inhibitors Cough  . Influenza Virus Vaccine Split     Patient states she is allergic to flu shot  . Lipitor [Atorvastatin]     aches  . Oxycodone Hcl     Tongue swelling   . Venomil Yellow Jacket Venom [White Faced Hornet Venom]     Swollen lips, tongue swelling   . Zetia [Ezetimibe]     Muscle aches  . Keflex [Cephalexin] Rash    Swollen lips, tongue swelling  . Penicillins Rash    Swollen lips,  tongue swelling     Family History  Problem Relation Age of Onset  . Stroke Mother   . Hyperlipidemia Mother   . Arthritis Mother   . Hypertension Mother   . Diabetes Mother   . Varicose Veins Mother   . Arthritis Sister   . Hypertension Sister   . Diabetes Sister   . Heart disease Sister   . Arthritis Brother   . Hypertension Brother   . Heart disease Brother      Prior to Admission medications   Medication Sig Start Date End Date Taking? Authorizing Provider  Calcium Carbonate-Vitamin D  (CALTRATE 600+D) 600-400 MG-UNIT tablet Take 1 tablet by mouth daily.   Yes [provider]  cloNIDine (CATAPRES) 0.1 MG tablet Take 0.1 mg by mouth 2 (two) times daily.   Yes [provider]  metoprolol succinate (TOPROL-XL) 50 MG 24 hr tablet Take 50 mg by mouth daily. Take with or immediately following a meal.   Yes [provider]  ondansetron (ZOFRAN-ODT) 4 MG disintegrating tablet Take 4 mg by mouth 3 (three) times daily as needed for nausea. 04/09/19  Yes [provider]  oxyCODONE (OXYCONTIN) 15 mg 12 hr tablet Take 15 mg by mouth every 12 (twelve) hours.   Yes [provider]  rosuvastatin (CRESTOR) 20 MG tablet Take 20 mg by mouth daily.   Yes [provider]  tiotropium (SPIRIVA) 18 MCG inhalation capsule Place 18 mcg into inhaler and inhale daily.   Yes [provider]  zolpidem (AMBIEN) 5 MG tablet Take 5 mg by mouth at bedtime as needed for sleep.   Yes [provider]    Physical Exam: Vitals:   04/10/19 1529 04/10/19 1907  BP: (!) 134/92 129/72  Pulse: 88 88  Resp: 18 16  Temp: 98.1 F (36.7 C)   TempSrc: Oral   SpO2: 96% 96%    Constitutional:  . Appears calm and comfortable Eyes:  . No pallor. No jaundice.  ENMT:  . external ears, nose appear normal Neck:  . Neck is supple. No JVD Respiratory:  . CTA bilaterally, no w/r/r.  . Respiratory effort normal. No retractions or accessory muscle use Cardiovascular:  . S1S2 . No LE extremity edema   Abdomen:  . Abdomen obese, soft and non tender. Organs are difficult to assess. Neurologic:  . Awake and alert. . Moves all limbs.  Wt Readings from Last 3 Encounters:  02/05/16 70.5 kg  11/08/15 69.2 kg    I have personally reviewed following labs and imaging studies  Labs on Admission:  CBC: Recent Labs  Lab 04/10/19 1644  WBC 10.0  HGB 13.1  HCT 40.5  MCV 89.2  PLT 009   Basic Metabolic Panel: Recent Labs  Lab 04/10/19 1644  NA  135  K 4.0  CL 98  CO2 26  GLUCOSE 89  BUN 13  CREATININE 0.66  CALCIUM 9.0   Liver Function Tests: No results for input(s): AST, ALT, ALKPHOS, BILITOT, PROT, ALBUMIN in the last 168 hours. No results for input(s): LIPASE, AMYLASE in the last 168 hours. No results for input(s): AMMONIA in the last 168 hours. Coagulation Profile: No results for input(s): INR, PROTIME in the last 168 hours. Cardiac Enzymes: No results for input(s): CKTOTAL, CKMB, CKMBINDEX, TROPONINI in the last 168 hours. BNP (last 3 results) No results for input(s): PROBNP in the last 8760 hours. HbA1C: No results for input(s): HGBA1C in the last 72 hours. CBG: No results for input(s): GLUCAP  in the last 168 hours. Lipid Profile: No results for input(s): CHOL, HDL, LDLCALC, TRIG, CHOLHDL, LDLDIRECT in the last 72 hours. Thyroid Function Tests: No results for input(s): TSH, T4TOTAL, FREET4, T3FREE, THYROIDAB in the last 72 hours. Anemia Panel: No results for input(s): VITAMINB12, FOLATE, FERRITIN, TIBC, IRON, RETICCTPCT in the last 72 hours. Urine analysis: No results found for: COLORURINE, APPEARANCEUR, LABSPEC, PHURINE, GLUCOSEU, HGBUR, BILIRUBINUR, KETONESUR, PROTEINUR, UROBILINOGEN, NITRITE, LEUKOCYTESUR Sepsis Labs: @LABRCNTIP (procalcitonin:4,lacticidven:4) ) Recent Results (from the past 240 hour(s))  SARS Coronavirus 2 (CEPHEID - Performed in [place] hospital lab), Hosp Order     Status: None   Collection Time: 04/10/19  4:44 PM   Specimen: Nasopharyngeal Swab  Result Value Ref Range Status   SARS Coronavirus 2 NEGATIVE NEGATIVE Final    Comment: (NOTE) If result is NEGATIVE SARS-CoV-2 target nucleic acids are NOT DETECTED. The SARS-CoV-2 RNA is generally detectable in upper and lower  respiratory specimens during the acute phase of infection. The lowest  concentration of SARS-CoV-2 viral copies this assay can detect is 250  copies / mL. A negative result does not preclude SARS-CoV-2 infection   and should not be used as the sole basis for treatment or other  patient management decisions.  A negative result may occur with  improper specimen collection / handling, submission of specimen other  than nasopharyngeal swab, presence of viral mutation(s) within the  areas targeted by this assay, and inadequate number of viral copies  (<250 copies / mL). A negative result must be combined with clinical  observations, patient history, and epidemiological information. If result is POSITIVE SARS-CoV-2 target nucleic acids are DETECTED. The SARS-CoV-2 RNA is generally detectable in upper and lower  respiratory specimens dur ing the acute phase of infection.  Positive  results are indicative of active infection with SARS-CoV-2.  Clinical  correlation with patient history and other diagnostic information is  necessary to determine patient infection status.  Positive results do  not rule out bacterial infection or co-infection with other viruses. If result is PRESUMPTIVE POSTIVE SARS-CoV-2 nucleic acids MAY BE PRESENT.   A presumptive positive result was obtained on the submitted specimen  and confirmed on repeat testing.  While 2019 novel coronavirus  (SARS-CoV-2) nucleic acids may be present in the submitted sample  additional confirmatory testing may be necessary for epidemiological  and / or clinical management purposes  to differentiate between  SARS-CoV-2 and other Sarbecovirus currently known to infect humans.  If clinically indicated additional testing with an alternate test  methodology [phone]) is advised. The SARS-CoV-2 RNA is generally  detectable in upper and lower respiratory sp ecimens during the acute  phase of infection. The expected result is Negative. Fact Sheet for Patients:  StrictlyIdeas.no Fact Sheet for Healthcare Providers: BankingDealers.co.za This test is not yet approved or cleared by the [place] FDA and has  been authorized for detection and/or diagnosis of SARS-CoV-2 by FDA under an Emergency Use Authorization (EUA).  This EUA will remain in effect (meaning this test can be used) for the duration of the COVID-19 declaration under Section 564(b)(1) of the Act, 21 U.S.C. section 360bbb-3(b)(1), unless the authorization is terminated or revoked sooner. Performed at [facility], [place] [address], [place], [place] [address]       Radiological Exams on Admission: Dg Thoracic Spine 2 View  Result Date: 04/10/2019 CLINICAL DATA:  Worsening thoracic back pain. EXAM: THORACIC SPINE 2 VIEWS COMPARISON:  04/09/2019 chest CT. FINDINGS: Mild T6 vertebral compression fracture and  mild T12 vertebral compression fracture are stable compared to the chest CT study of 1 day prior. Previously described T9 vertebral compression fracture is occult by radiographs. No new thoracic fracture. No subluxation. Mild thoracic spondylosis. No suspicious focal osseous lesions appreciated. IMPRESSION: Mild T6 and T12 vertebral compression fractures are stable compared to chest CT of 1 day prior. Previously described T9 vertebral compression fractures radiographically occult. No new thoracic fracture. No subluxation. Mild thoracic spondylosis. Electronically Signed   By: Ilona Sorrel M.D.   On: 04/10/2019 17:23    Active Problems:   * No active hospital problems. *   Assessment/Plan Severe back pain: Observe patient in the hospital Optimize pain control Work-up possible course of back pain Patient is known to have compression fractures Further management will depend on hospital course.  Likely metastatic malignancy of unknown primary: Further work-up of the pulmonary mass during the hospital stay Pulmonary team is already aware of patient's admission. Further management will depend on lab work.  Hypertension: Continue to optimize.  COPD: Stable. No symptoms.  DVT prophylaxis: Subacute Lovenox  Code Status: Full Family Communication:  Disposition Plan: Will depend on hospital course Consults called: ER has already discussed case with pulmonary team Admission status: Observation  Time spent: 65 minutes  Dana Allan, MD  [facility] Pager #: [phone] 7PM-7AM contact night coverage as above  04/10/2019, 7:48 PM

## 2019-04-10 NOTE — ED Notes (Signed)
Pt ambulatory to bathroom

## 2019-04-10 NOTE — Telephone Encounter (Signed)
Spoke with pt's daughter in [employer].  Patient is having severe back pain.  She was in [place] ER and prescribed opiate medications.  These aren't sufficient to control her pain.  CT chest shows several compressions fractures and new fracture with possible lytic lesion.  Has stable Lt lung mass and borderline mediastinal adenopathy with emphysema.  Advised that it would probably best to have patient brought to the ER for further assessment and will likely need to be admitted for adequate pain management.  Could then proceed with arranging for biopsy while in hospital.  Explained that it would likely be difficult for patient to perform PFT given the amount of pain that is being described.

## 2019-04-10 NOTE — ED Triage Notes (Signed)
Patient reports newly diagnosed lung cancer mets to lymph nodes and spine. Reports running out of oxycodone and having continued pain.

## 2019-04-10 NOTE — ED Provider Notes (Signed)
[facility] DEPT Provider Note   CSN: [id number] Arrival date & time: 04/10/19  1518    History   Chief Complaint Chief Complaint  Patient presents with   Back Pain    HPI Sylvia Suarez is a 83 y.o. female.     HPI Patient presents with left-sided back pain.  Presumed metastatic lung cancer but does not have a tissue diagnosis yet.  Reportedly had a recent CT scan done at [facility] that showed some vertebral compression fractures and mets to the lymph nodes.  Pain is been uncontrolled at home with oxycodone.  No new fall.  No real shortness of breath or fever.  Reviewing records Dr. Lamonte Sakai recommended patient come to the ER for IV pain control admission to the hospital and potential tissue diagnosis Past Medical History:  Diagnosis Date   COPD (chronic obstructive pulmonary disease) ([place])    Grief    Hyperlipidemia    Hypertension    Insomnia    Leg pain    Macular degeneration of right eye    Macular degeneration, left eye    Osteoporosis    Varicose veins     Patient Active Problem List   Diagnosis Date Noted   Mass of left lung 04/04/2019   COPD (chronic obstructive pulmonary disease) ([place]) 04/04/2019   Varicose veins of both lower extremities with complications 53/61/4431    Past Surgical History:  Procedure Laterality Date   APPENDECTOMY  1975   repair of elbow fracture  2013, revised 01-2014     OB History   No obstetric history on file.      Home Medications    Prior to Admission medications   Medication Sig Start Date End Date Taking? Authorizing Provider  Calcium Carbonate-Vitamin D (CALTRATE 600+D) 600-400 MG-UNIT tablet Take 1 tablet by mouth daily.    [provider]  cloNIDine (CATAPRES) 0.1 MG tablet Take 0.1 mg by mouth 2 (two) times daily.    [provider]  metoprolol succinate (TOPROL-XL) 50 MG 24 hr tablet Take 50 mg by mouth daily. Take with or immediately  following a meal.    [provider]  oxycodone (OXY-IR) 5 MG capsule Take 5 mg by mouth every 6 (six) hours as needed.    [provider]  rosuvastatin (CRESTOR) 10 MG tablet Take 10 mg by mouth daily.    [provider]  tiotropium (SPIRIVA) 18 MCG inhalation capsule Place 18 mcg into inhaler and inhale daily.    [provider]  zolpidem (AMBIEN) 5 MG tablet Take 5 mg by mouth at bedtime as needed for sleep.    [provider]    Family History Family History  Problem Relation Age of Onset   Stroke Mother    Hyperlipidemia Mother    Arthritis Mother    Hypertension Mother    Diabetes Mother    Varicose Veins Mother    Arthritis Sister    Hypertension Sister    Diabetes Sister    Heart disease Sister    Arthritis Brother    Hypertension Brother    Heart disease Brother     Social History Social History   Tobacco Use   Smoking status: Former Smoker    Packs/day: 3.00    Years: 40.00    Pack years: 120.00    Quit date: 10/07/1995    Years since quitting: 23.5   Smokeless tobacco: Never Used  Substance Use Topics   Alcohol use: Yes  Alcohol/week: 2.0 standard drinks    Types: 2 Shots of liquor per week    Comment: twice weekly   Drug use: No     Allergies   Venomil wasp venom [wasp venom], Ace inhibitors, Influenza virus vaccine split, Lipitor [atorvastatin], Oxycodone hcl, Venomil yellow jacket venom [white faced hornet venom], Zetia [ezetimibe], Keflex [cephalexin], and Penicillins   Review of Systems Review of Systems  Constitutional: Negative for appetite change.  Respiratory: Negative for shortness of breath.   Cardiovascular: Positive for chest pain.  Gastrointestinal: Negative for abdominal pain.  Genitourinary: Negative for flank pain.  Musculoskeletal: Positive for back pain.  Skin: Negative for rash.  Neurological: Negative for weakness.  Psychiatric/Behavioral: Negative for confusion.      Physical Exam Updated Vital Signs BP (!) 134/92 (BP Location: Left Arm)    Pulse 88    Temp 98.1 F (36.7 C) (Oral)    Resp 18    SpO2 96%   Physical Exam Vitals signs and nursing note reviewed.  Constitutional:      Comments: Patient appears uncomfortable  Pulmonary:     Breath sounds: No rhonchi or rales.     Comments: Some left posterior chest tenderness. Chest:     Chest wall: Tenderness present.  Abdominal:     Tenderness: There is no abdominal tenderness.  Musculoskeletal:     Comments: Tenderness over lower thoracic spine without deformity  Skin:    General: Skin is warm.     Capillary Refill: Capillary refill takes less than 2 seconds.  Neurological:     Mental Status: She is alert and oriented to person, place, and time.      ED Treatments / Results  Labs (all labs ordered are listed, but only abnormal results are displayed) Labs Reviewed  SARS CORONAVIRUS 2 (HOSPITAL ORDER, [place] [facility])  BASIC METABOLIC PANEL  CBC    EKG None  Radiology Dg Thoracic Spine 2 View  Result Date: 04/10/2019 CLINICAL DATA:  Worsening thoracic back pain. EXAM: THORACIC SPINE 2 VIEWS COMPARISON:  04/09/2019 chest CT. FINDINGS: Mild T6 vertebral compression fracture and mild T12 vertebral compression fracture are stable compared to the chest CT study of 1 day prior. Previously described T9 vertebral compression fracture is occult by radiographs. No new thoracic fracture. No subluxation. Mild thoracic spondylosis. No suspicious focal osseous lesions appreciated. IMPRESSION: Mild T6 and T12 vertebral compression fractures are stable compared to chest CT of 1 day prior. Previously described T9 vertebral compression fractures radiographically occult. No new thoracic fracture. No subluxation. Mild thoracic spondylosis. Electronically Signed   By: Ilona Sorrel M.D.   On: 04/10/2019 17:23    Procedures Procedures (including critical care time)  Medications  Ordered in ED Medications  HYDROmorphone (DILAUDID) injection 0.5 mg (has no administration in time range)  HYDROmorphone (DILAUDID) injection 0.5 mg (0.5 mg Intravenous Given 04/10/19 1725)     Initial Impression / Assessment and Plan / ED Course  I have reviewed the triage vital signs and the nursing notes.  Pertinent labs & imaging results that were available during my care of the patient were reviewed by me and considered in my medical decision making (see chart for details).        Patient with back pain.  Known likely pathologic thoracic fractures.  X-ray stable from CT scan yesterday.  Pulmonology note thinks patient would benefit from admission to the hospital for pain control and potentially more the work-up and may be tissue diagnosis.  Patient  has been uncontrolled with her oral pain medicine.  Will admit to hospitalist. Final Clinical Impressions(s) / ED Diagnoses   Final diagnoses:  Metastatic cancer ([place])  Compression fracture of thoracic vertebra, initial encounter, unspecified thoracic vertebral level [facility])    ED Discharge Orders    None       Davonna Belling, MD 04/10/19 1836

## 2019-04-10 NOTE — Plan of Care (Signed)
Plan of care discussed.   

## 2019-04-11 DIAGNOSIS — M4854XA Collapsed vertebra, not elsewhere classified, thoracic region, initial encounter for fracture: Secondary | ICD-10-CM | POA: Diagnosis not present

## 2019-04-11 DIAGNOSIS — C799 Secondary malignant neoplasm of unspecified site: Secondary | ICD-10-CM | POA: Diagnosis not present

## 2019-04-11 LAB — CBC
HCT: 39.4 % (ref 36.0–46.0)
Hemoglobin: 12.2 g/dL (ref 12.0–15.0)
MCH: 28.5 pg (ref 26.0–34.0)
MCHC: 31 g/dL (ref 30.0–36.0)
MCV: 92.1 fL (ref 80.0–100.0)
Platelets: 238 10*3/uL (ref 150–400)
RBC: 4.28 MIL/uL (ref 3.87–5.11)
RDW: 12.5 % (ref 11.5–15.5)
WBC: 9.3 10*3/uL (ref 4.0–10.5)
nRBC: 0 % (ref 0.0–0.2)

## 2019-04-11 LAB — BASIC METABOLIC PANEL
Anion gap: 11 (ref 5–15)
BUN: 13 mg/dL (ref 8–23)
CO2: 27 mmol/L (ref 22–32)
Calcium: 8.7 mg/dL — ABNORMAL LOW (ref 8.9–10.3)
Chloride: 99 mmol/L (ref 98–111)
Creatinine, Ser: 0.59 mg/dL (ref 0.44–1.00)
GFR calc Af Amer: >60 mL/min (ref >60)
GFR calc non Af Amer: >60 mL/min (ref >60)
Glucose, Bld: 103 mg/dL — ABNORMAL HIGH (ref 70–99)
Potassium: 4.7 mmol/L (ref 3.5–5.1)
Sodium: 137 mmol/L (ref 135–145)

## 2019-04-11 MED ORDER — BISACODYL 10 MG RE SUPP
10.0000 mg | Freq: Once | RECTAL | Status: AC
  Administered 2019-04-11: 10 mg via RECTAL
  Filled 2019-04-11: qty 1

## 2019-04-11 MED ORDER — POLYETHYLENE GLYCOL 3350 17 G PO PACK
17.0000 g | PACK | Freq: Every day | ORAL | Status: DC
  Administered 2019-04-11: 17 g via ORAL
  Filled 2019-04-11: qty 1

## 2019-04-11 MED ORDER — OXYCODONE HCL 5 MG PO TABS: 5.0000 mg | ORAL_TABLET | Freq: Four times a day (QID) | ORAL | Status: DC | PRN

## 2019-04-11 MED ORDER — OXYCODONE HCL 5 MG PO TABS
5.0000 mg | ORAL_TABLET | ORAL | Status: DC | PRN
  Administered 2019-04-11 – 2019-04-12 (×4): 5 mg via ORAL
  Filled 2019-04-11 (×4): qty 1

## 2019-04-11 MED ORDER — SENNOSIDES-DOCUSATE SODIUM 8.6-50 MG PO TABS
1.0000 | ORAL_TABLET | Freq: Two times a day (BID) | ORAL | Status: DC
  Administered 2019-04-11 (×2): 1 via ORAL
  Filled 2019-04-11 (×2): qty 1

## 2019-04-11 NOTE — Progress Notes (Signed)
[facility] Progress Note Patient Details:  Sylvia Suarez 05/12/1936 [id number] Brace called in and [employer] already came to fit pt for it.         [name] 04/11/2019, 5:46 PM

## 2019-04-11 NOTE — Progress Notes (Signed)
PROGRESS NOTE    Sylvia Suarez  IRS:[id number] DOB: [birth year] DOA: 04/10/2019 PCP: Janine Limbo, PA-C   Brief Narrative: 83 year old with past medical history significant for hypertension, hyperlipidemia, COPD and reported metastatic malignancy of unknown primary.  Patient presented with uncontrolled back pain.  Patient developed back pain 3 weeks prior to admission after she tried to lift a pot of flowers.  She was a started on OxyContin twice a day without significant relief.  She presented to the hospital for further management of her pain.  ED the thoracic spine reveals mild T6 and T12 vertebral compression fracture are stable compared to the CT performed the day prior.  Previously described T9 vertebral compression fracture brachiographically occult.    Assessment & Plan:   Active Problems:   Back pain  1-Back pain: Related to compression fracture Continue with oxycontin.  Will add oxycodone PRN.  Bowel regimen.  PT evaluation.  Will order Brace to use as needed.  She is schedule for pet scan tomorrow.   Malignancy of unknown primary; Pulmonary ? Patient is schedule for PET scan 7-07. Will try to discharge her early morning if pain is controlled/  Discussed with Pulmonary, Dr. Halford Chessman, they are trying to arrange bronchoscopy biopsy for later during the week. daughter in law updated. \ Advised daughter in law to discussed with pulmonologist needs for PFT. Wont do it this admission due to patient having pain.   HTN; continue with clonidine.   COPD; stable . Continue with inhaler.    Estimated body mass index is 22.38 kg/m as calculated from the following:   Height as of this encounter: 5\' 6"  (1.676 m).   Weight as of this encounter: 62.9 kg.   DVT prophylaxis: Lovenox Code Status: DNR Family Communication: daughter in law.  Disposition Plan: home in 24 hours Consultants:   Pulmonologist   Procedures:   none  Antimicrobials:    Subjective: She report back  pain, wants to try oral oxycodone  No BM in few days  Objective: Vitals:   04/10/19 1907 04/10/19 2133 04/10/19 2259 04/11/19 0442  BP: 129/72 (!) 139/91  116/64  Pulse: 88 95  79  Resp: 16 18  18   Temp:  98.1 F (36.7 C)  98.1 F (36.7 C)  TempSrc:  Oral  Oral  SpO2: 96% 97%  97%  Weight:   62.9 kg   Height:   5\' 6"  (1.676 m)     Intake/Output Summary (Last 24 hours) at 04/11/2019 0914 Last data filed at 04/11/2019 0851 Gross per 24 hour  Intake 831.45 ml  Output -  Net 831.45 ml   Filed Weights   04/10/19 2259  Weight: 62.9 kg    Examination:  General exam: Appears calm and comfortable  Respiratory system: Clear to auscultation. Respiratory effort normal. Cardiovascular system: S1 & S2 heard, RRR. No JVD, murmurs, rubs, gallops or clicks. No pedal edema. Gastrointestinal system: Abdomen is nondistended, soft and nontender. No organomegaly or masses felt. Normal bowel sounds heard. Central nervous system: Alert and oriented. No focal neurological deficits. Extremities: Symmetric 5 x 5 power. Skin: No rashes, lesions or ulcers Psychiatry: Judgement and insight appear normal. Mood & affect appropriate.     Data Reviewed: I have personally reviewed following labs and imaging studies  CBC: Recent Labs  Lab 04/10/19 1644 04/11/19 0307  WBC 10.0 9.3  HGB 13.1 12.2  HCT 40.5 39.4  MCV 89.2 92.1  PLT 275 009   Basic Metabolic Panel: Recent Labs  Lab 04/10/19  1644 04/11/19 0307  NA 135 137  K 4.0 4.7  CL 98 99  CO2 26 27  GLUCOSE 89 103*  BUN 13 13  CREATININE 0.66 0.59  CALCIUM 9.0 8.7*   GFR: Estimated Creatinine Clearance: 52.5 mL/min (by C-G formula based on SCr of 0.59 mg/dL). Liver Function Tests: No results for input(s): AST, ALT, ALKPHOS, BILITOT, PROT, ALBUMIN in the last 168 hours. No results for input(s): LIPASE, AMYLASE in the last 168 hours. No results for input(s): AMMONIA in the last 168 hours. Coagulation Profile: No results for  input(s): INR, PROTIME in the last 168 hours. Cardiac Enzymes: No results for input(s): CKTOTAL, CKMB, CKMBINDEX, TROPONINI in the last 168 hours. BNP (last 3 results) No results for input(s): PROBNP in the last 8760 hours. HbA1C: No results for input(s): HGBA1C in the last 72 hours. CBG: No results for input(s): GLUCAP in the last 168 hours. Lipid Profile: No results for input(s): CHOL, HDL, LDLCALC, TRIG, CHOLHDL, LDLDIRECT in the last 72 hours. Thyroid Function Tests: No results for input(s): TSH, T4TOTAL, FREET4, T3FREE, THYROIDAB in the last 72 hours. Anemia Panel: No results for input(s): VITAMINB12, FOLATE, FERRITIN, TIBC, IRON, RETICCTPCT in the last 72 hours. Sepsis Labs: No results for input(s): PROCALCITON, LATICACIDVEN in the last 168 hours.  Recent Results (from the past 240 hour(s))  SARS Coronavirus 2 (CEPHEID - Performed in [place] hospital lab), Hosp Order     Status: None   Collection Time: 04/10/19  4:44 PM   Specimen: Nasopharyngeal Swab  Result Value Ref Range Status   SARS Coronavirus 2 NEGATIVE NEGATIVE Final    Comment: (NOTE) If result is NEGATIVE SARS-CoV-2 target nucleic acids are NOT DETECTED. The SARS-CoV-2 RNA is generally detectable in upper and lower  respiratory specimens during the acute phase of infection. The lowest  concentration of SARS-CoV-2 viral copies this assay can detect is 250  copies / mL. A negative result does not preclude SARS-CoV-2 infection  and should not be used as the sole basis for treatment or other  patient management decisions.  A negative result may occur with  improper specimen collection / handling, submission of specimen other  than nasopharyngeal swab, presence of viral mutation(s) within the  areas targeted by this assay, and inadequate number of viral copies  (<250 copies / mL). A negative result must be combined with clinical  observations, patient history, and epidemiological information. If result is  POSITIVE SARS-CoV-2 target nucleic acids are DETECTED. The SARS-CoV-2 RNA is generally detectable in upper and lower  respiratory specimens dur ing the acute phase of infection.  Positive  results are indicative of active infection with SARS-CoV-2.  Clinical  correlation with patient history and other diagnostic information is  necessary to determine patient infection status.  Positive results do  not rule out bacterial infection or co-infection with other viruses. If result is PRESUMPTIVE POSTIVE SARS-CoV-2 nucleic acids MAY BE PRESENT.   A presumptive positive result was obtained on the submitted specimen  and confirmed on repeat testing.  While 2019 novel coronavirus  (SARS-CoV-2) nucleic acids may be present in the submitted sample  additional confirmatory testing may be necessary for epidemiological  and / or clinical management purposes  to differentiate between  SARS-CoV-2 and other Sarbecovirus currently known to infect humans.  If clinically indicated additional testing with an alternate test  methodology [phone]) is advised. The SARS-CoV-2 RNA is generally  detectable in upper and lower respiratory sp ecimens during the acute  phase of infection.  The expected result is Negative. Fact Sheet for Patients:  StrictlyIdeas.no Fact Sheet for Healthcare Providers: BankingDealers.co.za This test is not yet approved or cleared by the [place] FDA and has been authorized for detection and/or diagnosis of SARS-CoV-2 by FDA under an Emergency Use Authorization (EUA).  This EUA will remain in effect (meaning this test can be used) for the duration of the COVID-19 declaration under Section 564(b)(1) of the Act, 21 U.S.C. section 360bbb-3(b)(1), unless the authorization is terminated or revoked sooner. Performed at [facility], [place] [address], [place],  [address]          Radiology Studies: Dg  Thoracic Spine 2 View  Result Date: 04/10/2019 CLINICAL DATA:  Worsening thoracic back pain. EXAM: THORACIC SPINE 2 VIEWS COMPARISON:  04/09/2019 chest CT. FINDINGS: Mild T6 vertebral compression fracture and mild T12 vertebral compression fracture are stable compared to the chest CT study of 1 day prior. Previously described T9 vertebral compression fracture is occult by radiographs. No new thoracic fracture. No subluxation. Mild thoracic spondylosis. No suspicious focal osseous lesions appreciated. IMPRESSION: Mild T6 and T12 vertebral compression fractures are stable compared to chest CT of 1 day prior. Previously described T9 vertebral compression fractures radiographically occult. No new thoracic fracture. No subluxation. Mild thoracic spondylosis. Electronically Signed   By: Ilona Sorrel M.D.   On: 04/10/2019 17:23        Scheduled Meds: . bisacodyl  10 mg Rectal Once  . calcium-vitamin D  1 tablet Oral Daily  . cloNIDine  0.1 mg Oral BID  . enoxaparin (LOVENOX) injection  40 mg Subcutaneous QHS  . feeding supplement (ENSURE ENLIVE)  237 mL Oral BID BM  . lidocaine  1 patch Transdermal QHS  . metoprolol succinate  50 mg Oral Daily  . oxyCODONE  15 mg Oral Q12H  . polyethylene glycol  17 g Oral Daily  . rosuvastatin  20 mg Oral Daily  . senna-docusate  1 tablet Oral BID  . umeclidinium bromide  1 puff Inhalation Daily   Continuous Infusions: . sodium chloride 50 mL/hr at 04/11/19 0600     LOS: 0 days    Time spent: 35 minutes    Elmarie Shiley, MD [facility] Pager [phone]  If 7PM-7AM, please contact night-coverage [email or web address] Password [email or web address] 04/11/2019, 9:14 AM

## 2019-04-11 NOTE — Telephone Encounter (Signed)
[name] states patient is in the hospital at [facility].  Would like to discuss.  [name] phone number is [phone].

## 2019-04-11 NOTE — Telephone Encounter (Signed)
[name] is not listed as a contact for the patient. Based on Dr. Agustina Caroli response, any concerns regarding patient care will be addressed at the hospital during evaluation and treatment. Nothing further needed.

## 2019-04-11 NOTE — Telephone Encounter (Signed)
I'm sorry but I'm unavailable all week. If there is an issue that needs discussion (for example assessing the patient for potential need for a procedure while admitted) then the admitting team will need to discuss with our inpatient service through either formal or brief consultation.

## 2019-04-11 NOTE — Care Management Obs Status (Signed)
[place] NOTIFICATION   Patient Details  Name: Sylvia Suarez MRN: [id number] Date of Birth: [birth year]   Medicare Observation Status Notification Given:  Yes    Leeroy Cha, RN 04/11/2019, 9:52 AM

## 2019-04-12 ENCOUNTER — Other Ambulatory Visit: Payer: Self-pay

## 2019-04-12 ENCOUNTER — Encounter (HOSPITAL_COMMUNITY)
Admission: RE | Admit: 2019-04-12 | Discharge: 2019-04-12 | Disposition: A | Discharge status: Home / Self Care | Payer: Medicare Other | Source: Ambulatory Visit | Attending: Emergency Medicine | Admitting: Emergency Medicine

## 2019-04-12 DIAGNOSIS — J90 Pleural effusion, not elsewhere classified: Secondary | ICD-10-CM | POA: Insufficient documentation

## 2019-04-12 DIAGNOSIS — M4854XA Collapsed vertebra, not elsewhere classified, thoracic region, initial encounter for fracture: Secondary | ICD-10-CM | POA: Diagnosis not present

## 2019-04-12 DIAGNOSIS — E785 Hyperlipidemia, unspecified: Secondary | ICD-10-CM | POA: Diagnosis not present

## 2019-04-12 DIAGNOSIS — S22000A Wedge compression fracture of unspecified thoracic vertebra, initial encounter for closed fracture: Secondary | ICD-10-CM | POA: Diagnosis not present

## 2019-04-12 DIAGNOSIS — I10 Essential (primary) hypertension: Secondary | ICD-10-CM | POA: Insufficient documentation

## 2019-04-12 DIAGNOSIS — C7951 Secondary malignant neoplasm of bone: Secondary | ICD-10-CM | POA: Diagnosis not present

## 2019-04-12 DIAGNOSIS — R911 Solitary pulmonary nodule: Secondary | ICD-10-CM | POA: Insufficient documentation

## 2019-04-12 DIAGNOSIS — Z79899 Other long term (current) drug therapy: Secondary | ICD-10-CM | POA: Diagnosis not present

## 2019-04-12 DIAGNOSIS — J449 Chronic obstructive pulmonary disease, unspecified: Secondary | ICD-10-CM | POA: Insufficient documentation

## 2019-04-12 DIAGNOSIS — Z87891 Personal history of nicotine dependence: Secondary | ICD-10-CM | POA: Diagnosis not present

## 2019-04-12 DIAGNOSIS — Z8249 Family history of ischemic heart disease and other diseases of the circulatory system: Secondary | ICD-10-CM | POA: Diagnosis not present

## 2019-04-12 LAB — GLUCOSE, CAPILLARY: Glucose-Capillary: 91 mg/dL (ref 70–99)

## 2019-04-12 IMAGING — PT NUCLEAR MEDICINE PET IMAGE INITIAL (PI) SKULL BASE TO THIGH
1 series · 5 of 5 positions shown · non-contrast
Comparison: Chest CT on 04/09/2019

CLINICAL DATA: Initial treatment strategy for left upper lobe
pulmonary mass.

EXAM:
NUCLEAR MEDICINE PET SKULL BASE TO THIGH
TECHNIQUE: 6.9 mCi F-18 FDG was injected intravenously. Full-ring PET imaging
was performed from the skull base to thigh after the radiotracer. CT
data was obtained and used for attenuation correction and anatomic
localization.
Fasting blood glucose: 91 mg/dl

[Series 1079: results mm oncology reading · 1.0mm · 0.80mm/px · 5 of 5 slices shown]
[im 1/5]
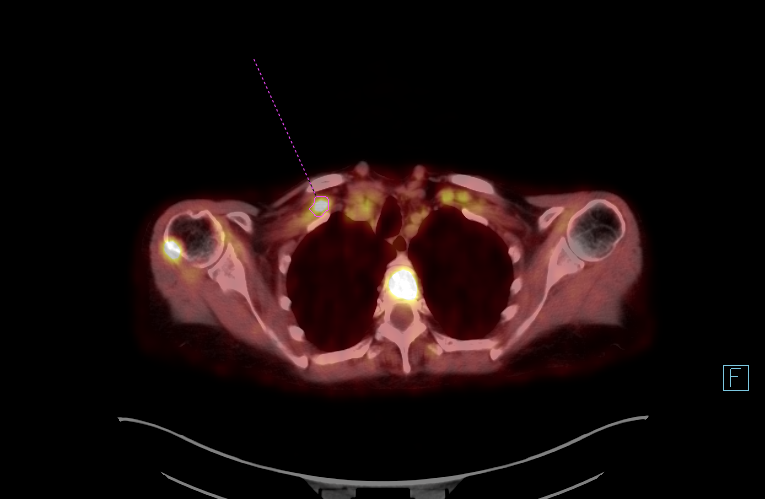
[im 2/5]
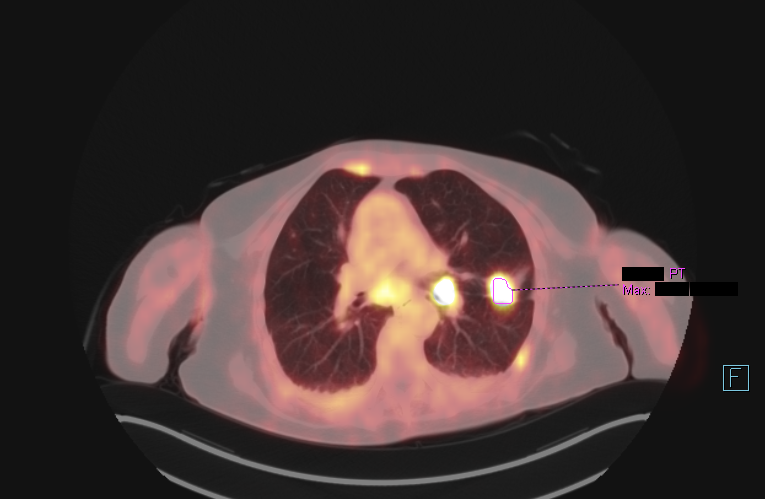
[im 3/5]
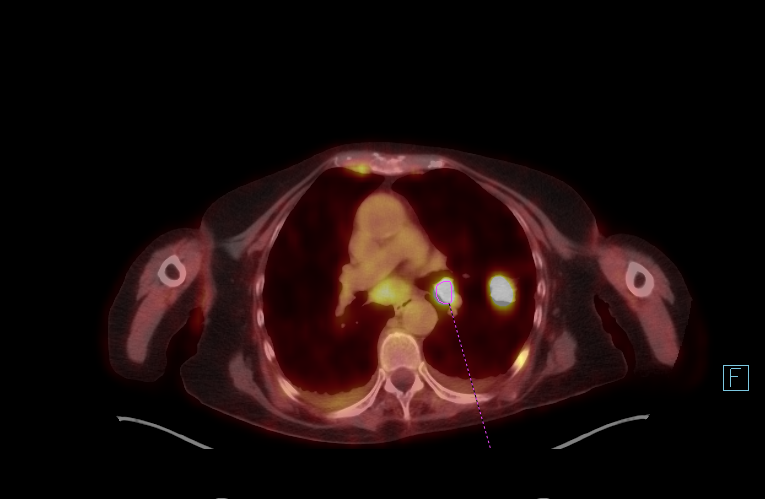
[im 4/5]
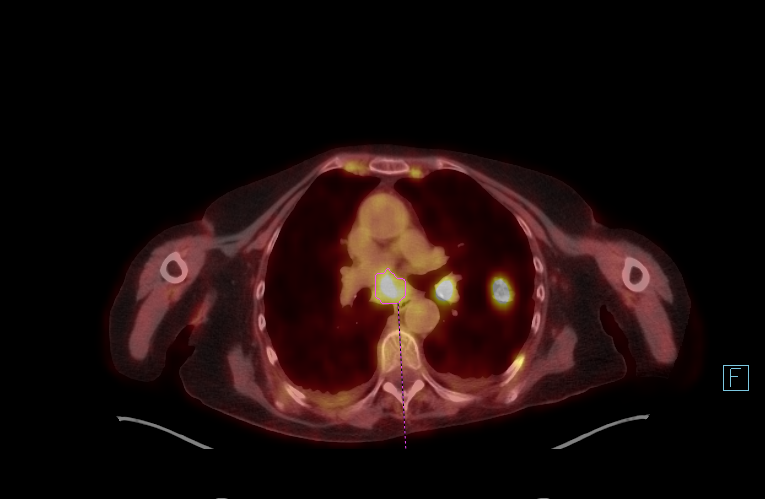
[im 5/5]
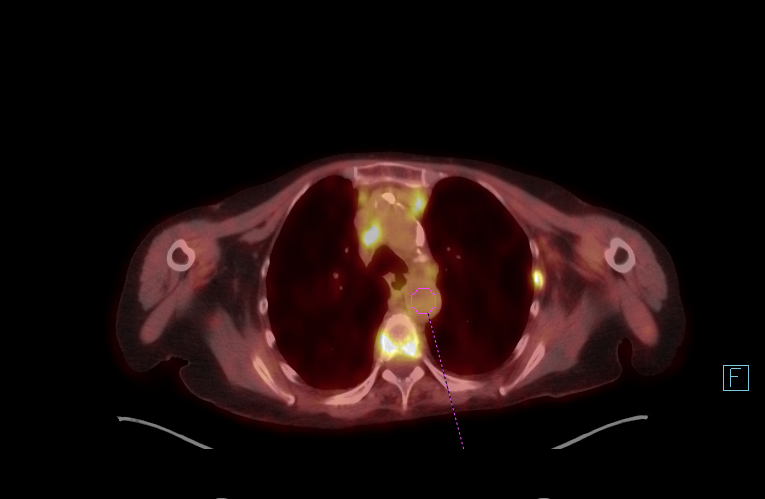

[5 of 5 positions shown; findings below may reference images not displayed]

FINDINGS: Mediastinal blood-pool activity (background): SUV max =

Liver activity (reference): SUV max = N/A

NECK: Mild bilateral supraclavicular lymphadenopathy which is
hypermetabolic. Index lymph node in the right supraclavicular region
measures 12 mm and has SUV max of 7.1.

Incidental CT findings:  None.

CHEST: 3cm hypermetabolic spiculated mass is seen in the posterior
left upper lobe which abuts the major fissure. This has SUV max of
13.2. Multiple sub-cm pulmonary nodules are seen in both lungs, some
of which show mild FDG uptake, suspicious for pulmonary metastases.
Tiny bilateral pleural effusions.

Mild left hilar lymphadenopathy has SUV max of 14.4. Diffuse
hypermetabolic mediastinal lymphadenopathy is seen, with index lymph
node in the subcarinal region measuring 2.4 cm, with SUV max of

Incidental CT findings:  None.

ABDOMEN/PELVIS: No abnormal hypermetabolic activity within the
liver, pancreas, adrenal glands, or spleen. No hypermetabolic lymph
nodes in the abdomen or pelvis. Cystic lesion in the left adnexa
measures 3.9 x 2.3 cm, but shows no FDG uptake, consistent with
benign etiology.

Incidental CT findings:  None.

SKELETON: Numerous hypermetabolic lytic bone metastases are seen
throughout the neck, chest, abdomen, pelvis, and proximal femurs.

Incidental CT findings:  None.
IMPRESSION: 3 cm hypermetabolic spiculated mass in the posterior left upper
lobe, consistent with primary bronchogenic carcinoma.

Multiple sub-cm bilateral pulmonary nodules, some showing mild FDG
uptake, suspicious for bilateral pulmonary metastases.

Tiny bilateral pleural effusions.

Metastatic lymphadenopathy in the left hilum, mediastinum, and
bilateral supraclavicular regions.

Diffuse hypermetabolic bone metastases.

## 2019-04-12 MED ORDER — POLYETHYLENE GLYCOL 3350 17 G PO PACK: 17.0000 g | PACK | Freq: Every day | ORAL | 0 refills | Status: AC

## 2019-04-12 MED ORDER — FLUDEOXYGLUCOSE F - 18 (FDG) INJECTION
6.8700 | Freq: Once | INTRAVENOUS | Status: AC
  Administered 2019-04-12: 6.87 via INTRAVENOUS

## 2019-04-12 MED ORDER — ONDANSETRON HCL 4 MG PO TABS
4.0000 mg | ORAL_TABLET | Freq: Three times a day (TID) | ORAL | Status: DC | PRN
  Administered 2019-04-12: 4 mg via ORAL
  Filled 2019-04-12: qty 1

## 2019-04-12 MED ORDER — OXYCODONE HCL ER 15 MG PO T12A: 15.0000 mg | EXTENDED_RELEASE_TABLET | Freq: Two times a day (BID) | ORAL | 0 refills | Status: AC

## 2019-04-12 MED ORDER — ONDANSETRON HCL 4 MG/2ML IJ SOLN: 4.0000 mg | Freq: Four times a day (QID) | INTRAMUSCULAR | Status: DC | PRN

## 2019-04-12 MED ORDER — OXYCODONE HCL 5 MG PO TABS: 5.0000 mg | ORAL_TABLET | ORAL | 0 refills | Status: AC | PRN

## 2019-04-12 MED ORDER — LIDOCAINE 5 % EX PTCH: 1.0000 | MEDICATED_PATCH | Freq: Every day | CUTANEOUS | 0 refills | Status: AC

## 2019-04-12 MED ORDER — SENNOSIDES-DOCUSATE SODIUM 8.6-50 MG PO TABS: 1.0000 | ORAL_TABLET | Freq: Two times a day (BID) | ORAL | 0 refills | Status: AC

## 2019-04-12 NOTE — Discharge Summary (Signed)
Physician Discharge Summary  Sylvia Suarez WCB:[id number] DOB: 01/19/1936 DOA: 04/10/2019  PCP: Janine Limbo, PA-C  Admit date: 04/10/2019 Discharge date: 04/12/2019  Admitted From: Home  Disposition: Home   Recommendations for Outpatient Follow-up:  1. Follow up with PCP in 1-2 weeks 2. Please obtain BMP/CBC in one week 3. Follow up with pulmonology for further care of lung mass and compression fracture.  4. Follow up result PET scan.    Discharge Condition: Stable.  CODE STATUS: full code Diet recommendation: Heart Healthy   Brief/Interim Summary: 83 year old with past medical history significant for hypertension, hyperlipidemia, COPD and reported metastatic malignancy of unknown primary.  Patient presented with uncontrolled back pain.  Patient developed back pain 3 weeks prior to admission after she tried to lift a pot of flowers.  She was a started on OxyContin twice a day without significant relief.  She presented to the hospital for further management of her pain.  ED the thoracic spine reveals mild T6 and T12 vertebral compression fracture are stable compared to the CT performed the day prior.  Previously described T9 vertebral compression fracture brachiographically occult.   1-Back pain: Related to compression fracture Continue with oxycontin.  Started oxycodone PRN.  Bowel regimen.  PT evaluation.  Brace to use as needed.  She is schedule for pet scan today  Pain controlled on oral regimen , plan to discharge today.   Malignancy of unknown primary; Pulmonary ? Patient is schedule for PET scan 7-07. Will try to discharge her early morning if pain is controlled/  Discussed with Pulmonary, Dr. Halford Chessman, they are trying to arrange  biopsy for later during the week. daughter in law updated. Further work up depending on PET scan result.  Advised daughter in law to discussed with pulmonologist needs for PFT. Wont do it this admission due to patient having pain.   HTN; Continue  with clonidine.   COPD; stable . Continue with inhaler.   Discharge Diagnoses:  Active Problems:   Back pain   Compression Fracture.    Malignancy unknown primary   Discharge Instructions  Discharge Instructions    Diet - low sodium heart healthy   Complete by: As directed    Increase activity slowly   Complete by: As directed      Allergies as of 04/12/2019      Reactions   Venomil Wasp Venom [wasp Venom] Anaphylaxis   Ace Inhibitors Cough   Influenza Virus Vaccine Split    Patient states she is allergic to flu shot   Lipitor [atorvastatin]    Aches, takes crestor   Oxycodone Hcl    Tongue swelling , Clarified that she is not allergic, but it causes dry mouth.   Venomil Yellow Jacket Venom [white Faced Hornet Venom]    Swollen lips, tongue swelling   Zetia [ezetimibe]    Muscle aches   Keflex [cephalexin] Rash   Swollen lips, tongue swelling   Penicillins Rash   Swollen lips, tongue swelling      Medication List    TAKE these medications   Caltrate 600+D 600-400 MG-UNIT tablet Generic drug: Calcium Carbonate-Vitamin D Take 1 tablet by mouth daily.   cloNIDine 0.1 MG tablet Commonly known as: CATAPRES Take 0.1 mg by mouth 2 (two) times daily.   lidocaine 5 % Commonly known as: LIDODERM Place 1 patch onto the skin at bedtime. Remove & Discard patch within 12 hours or as directed by MD   metoprolol succinate 50 MG 24 hr tablet Commonly known as:  TOPROL-XL Take 50 mg by mouth daily. Take with or immediately following a meal.   ondansetron 4 MG disintegrating tablet Commonly known as: ZOFRAN-ODT Take 4 mg by mouth 3 (three) times daily as needed for nausea.   oxyCODONE 15 mg 12 hr tablet Commonly known as: OXYCONTIN Take 1 tablet (15 mg total) by mouth every 12 (twelve) hours. What changed: Another medication with the same name was added. Make sure you understand how and when to take each.   oxyCODONE 5 MG immediate release tablet Commonly known as: Oxy  IR/ROXICODONE Take 1 tablet (5 mg total) by mouth every 4 (four) hours as needed for severe pain. What changed: You were already taking a medication with the same name, and this prescription was added. Make sure you understand how and when to take each.   polyethylene glycol 17 g packet Commonly known as: MIRALAX / GLYCOLAX Take 17 g by mouth daily.   rosuvastatin 20 MG tablet Commonly known as: CRESTOR Take 20 mg by mouth daily.   senna-docusate 8.6-50 MG tablet Commonly known as: Senokot-S Take 1 tablet by mouth 2 (two) times daily.   tiotropium 18 MCG inhalation capsule Commonly known as: SPIRIVA Place 18 mcg into inhaler and inhale daily.   zolpidem 5 MG tablet Commonly known as: AMBIEN Take 5 mg by mouth at bedtime as needed for sleep.      Follow-up Information    Parrett, Fonnie Mu, NP Follow up on 04/22/2019.   Specialty: Pulmonary Disease Why: Appt at 2:30 PM to follow up on plan of care, PET scan. Please arrive at 2:15 PM for check in.  Contact information: [address] [place] [address] [phone]          Allergies  Allergen Reactions  . Venomil Wasp Venom [Wasp Venom] Anaphylaxis  . Ace Inhibitors Cough  . Influenza Virus Vaccine Split     Patient states she is allergic to flu shot  . Lipitor [Atorvastatin]     Aches, takes crestor  . Oxycodone Hcl     Tongue swelling , Clarified that she is not allergic, but it causes dry mouth.   Horris Latino Yellow Jacket Venom [White Faced Hornet Venom]     Swollen lips, tongue swelling   . Zetia [Ezetimibe]     Muscle aches  . Keflex [Cephalexin] Rash    Swollen lips, tongue swelling  . Penicillins Rash    Swollen lips, tongue swelling     Consultations:  Phone conversation with Dr Halford Chessman.    Procedures/Studies: Dg Thoracic Spine 2 View  Result Date: 04/10/2019 CLINICAL DATA:  Worsening thoracic back pain. EXAM: THORACIC SPINE 2 VIEWS COMPARISON:  04/09/2019 chest CT. FINDINGS: Mild T6  vertebral compression fracture and mild T12 vertebral compression fracture are stable compared to the chest CT study of 1 day prior. Previously described T9 vertebral compression fracture is occult by radiographs. No new thoracic fracture. No subluxation. Mild thoracic spondylosis. No suspicious focal osseous lesions appreciated. IMPRESSION: Mild T6 and T12 vertebral compression fractures are stable compared to chest CT of 1 day prior. Previously described T9 vertebral compression fractures radiographically occult. No new thoracic fracture. No subluxation. Mild thoracic spondylosis. Electronically Signed   By: Ilona Sorrel M.D.   On: 04/10/2019 17:23   Nm Pet Image Initial (pi) Skull Base To Thigh  Result Date: 04/12/2019 CLINICAL DATA:  Initial treatment strategy for left upper lobe pulmonary mass. EXAM: NUCLEAR MEDICINE PET SKULL BASE TO THIGH TECHNIQUE: 6.9 mCi  F-18 FDG was injected intravenously. Full-ring PET imaging was performed from the skull base to thigh after the radiotracer. CT data was obtained and used for attenuation correction and anatomic localization. Fasting blood glucose: 91 mg/dl COMPARISON:  Chest CT on 04/09/2019 FINDINGS: Mediastinal blood-pool activity (background): SUV max = 2.7 Liver activity (reference): SUV max = N/A NECK: Mild bilateral supraclavicular lymphadenopathy which is hypermetabolic. Index lymph node in the right supraclavicular region measures 12 mm and has SUV max of 7.1. Incidental CT findings:  None. CHEST: 3cm hypermetabolic spiculated mass is seen in the posterior left upper lobe which abuts the major fissure. This has SUV max of 13.2. Multiple sub-cm pulmonary nodules are seen in both lungs, some of which show mild FDG uptake, suspicious for pulmonary metastases. Tiny bilateral pleural effusions. Mild left hilar lymphadenopathy has SUV max of 14.4. Diffuse hypermetabolic mediastinal lymphadenopathy is seen, with index lymph node in the subcarinal region measuring 2.4  cm, with SUV max of 7.2 Incidental CT findings:  None. ABDOMEN/PELVIS: No abnormal hypermetabolic activity within the liver, pancreas, adrenal glands, or spleen. No hypermetabolic lymph nodes in the abdomen or pelvis. Cystic lesion in the left adnexa measures 3.9 x 2.3 cm, but shows no FDG uptake, consistent with benign etiology. Incidental CT findings:  None. SKELETON: Numerous hypermetabolic lytic bone metastases are seen throughout the neck, chest, abdomen, pelvis, and proximal femurs. Incidental CT findings:  None. IMPRESSION: 3 cm hypermetabolic spiculated mass in the posterior left upper lobe, consistent with primary bronchogenic carcinoma. Multiple sub-cm bilateral pulmonary nodules, some showing mild FDG uptake, suspicious for bilateral pulmonary metastases. Tiny bilateral pleural effusions. Metastatic lymphadenopathy in the left hilum, mediastinum, and bilateral supraclavicular regions. Diffuse hypermetabolic bone metastases. Electronically Signed   By: Marlaine Hind M.D.   On: 04/12/2019 14:39     Subjective: Pain is controlled with oxycodone. Brace is helping   Discharge Exam: Vitals:   04/12/19 0536 04/12/19 0805  BP: 120/73   Pulse: 78 75  Resp: 18 16  Temp: 97.9 F (36.6 C)   SpO2:  99%     General: Pt is alert, awake, not in acute distress Cardiovascular: RRR, S1/S2 +, no rubs, no gallops Respiratory: CTA bilaterally, no wheezing, no rhonchi Abdominal: Soft, NT, ND, bowel sounds + Extremities: no edema, no cyanosis    The results of significant diagnostics from this hospitalization (including imaging, microbiology, ancillary and laboratory) are listed below for reference.     Microbiology: Recent Results (from the past 240 hour(s))  SARS Coronavirus 2 (CEPHEID - Performed in [facility] hospital lab), Hosp Order     Status: None   Collection Time: 04/10/19  4:44 PM   Specimen: Nasopharyngeal Swab  Result Value Ref Range Status   SARS Coronavirus 2 NEGATIVE NEGATIVE  Final    Comment: (NOTE) If result is NEGATIVE SARS-CoV-2 target nucleic acids are NOT DETECTED. The SARS-CoV-2 RNA is generally detectable in upper and lower  respiratory specimens during the acute phase of infection. The lowest  concentration of SARS-CoV-2 viral copies this assay can detect is 250  copies / mL. A negative result does not preclude SARS-CoV-2 infection  and should not be used as the sole basis for treatment or other  patient management decisions.  A negative result may occur with  improper specimen collection / handling, submission of specimen other  than nasopharyngeal swab, presence of viral mutation(s) within the  areas targeted by this assay, and inadequate number of viral copies  (<250 copies / mL). A  negative result must be combined with clinical  observations, patient history, and epidemiological information. If result is POSITIVE SARS-CoV-2 target nucleic acids are DETECTED. The SARS-CoV-2 RNA is generally detectable in upper and lower  respiratory specimens dur ing the acute phase of infection.  Positive  results are indicative of active infection with SARS-CoV-2.  Clinical  correlation with patient history and other diagnostic information is  necessary to determine patient infection status.  Positive results do  not rule out bacterial infection or co-infection with other viruses. If result is PRESUMPTIVE POSTIVE SARS-CoV-2 nucleic acids MAY BE PRESENT.   A presumptive positive result was obtained on the submitted specimen  and confirmed on repeat testing.  While 2019 novel coronavirus  (SARS-CoV-2) nucleic acids may be present in the submitted sample  additional confirmatory testing may be necessary for epidemiological  and / or clinical management purposes  to differentiate between  SARS-CoV-2 and other Sarbecovirus currently known to infect humans.  If clinically indicated additional testing with an alternate test  methodology [phone]) is advised. The  SARS-CoV-2 RNA is generally  detectable in upper and lower respiratory sp ecimens during the acute  phase of infection. The expected result is Negative. Fact Sheet for Patients:  StrictlyIdeas.no Fact Sheet for Healthcare Providers: BankingDealers.co.za This test is not yet approved or cleared by the [place] FDA and has been authorized for detection and/or diagnosis of SARS-CoV-2 by FDA under an Emergency Use Authorization (EUA).  This EUA will remain in effect (meaning this test can be used) for the duration of the COVID-19 declaration under Section 564(b)(1) of the Act, 21 U.S.C. section 360bbb-3(b)(1), unless the authorization is terminated or revoked sooner. Performed at [facility], [place] [address] [place], [place] [address]      Labs: BNP (last 3 results) No results for input(s): BNP in the last 8760 hours. Basic Metabolic Panel: Recent Labs  Lab 04/10/19 1644 04/11/19 0307  NA 135 137  K 4.0 4.7  CL 98 99  CO2 26 27  GLUCOSE 89 103*  BUN 13 13  CREATININE 0.66 0.59  CALCIUM 9.0 8.7*   Liver Function Tests: No results for input(s): AST, ALT, ALKPHOS, BILITOT, PROT, ALBUMIN in the last 168 hours. No results for input(s): LIPASE, AMYLASE in the last 168 hours. No results for input(s): AMMONIA in the last 168 hours. CBC: Recent Labs  Lab 04/10/19 1644 04/11/19 0307  WBC 10.0 9.3  HGB 13.1 12.2  HCT 40.5 39.4  MCV 89.2 92.1  PLT 275 238   Cardiac Enzymes: No results for input(s): CKTOTAL, CKMB, CKMBINDEX, TROPONINI in the last 168 hours. BNP: Invalid input(s): POCBNP CBG: Recent Labs  Lab 04/12/19 1055  GLUCAP 91   D-Dimer No results for input(s): DDIMER in the last 72 hours. Hgb A1c No results for input(s): HGBA1C in the last 72 hours. Lipid Profile No results for input(s): CHOL, HDL, LDLCALC, TRIG, CHOLHDL, LDLDIRECT in the last 72 hours. Thyroid function studies No results  for input(s): TSH, T4TOTAL, T3FREE, THYROIDAB in the last 72 hours.  Invalid input(s): FREET3 Anemia work up No results for input(s): VITAMINB12, FOLATE, FERRITIN, TIBC, IRON, RETICCTPCT in the last 72 hours. Urinalysis No results found for: COLORURINE, APPEARANCEUR, [place], [place], GLUCOSEU, [place], BILIRUBINUR, KETONESUR, PROTEINUR, UROBILINOGEN, NITRITE, LEUKOCYTESUR Sepsis Labs Invalid input(s): PROCALCITONIN,  WBC,  LACTICIDVEN Microbiology Recent Results (from the past 240 hour(s))  SARS Coronavirus 2 (CEPHEID - Performed in [facility] hospital lab), Hosp Order     Status: None  Collection Time: 04/10/19  4:44 PM   Specimen: Nasopharyngeal Swab  Result Value Ref Range Status   SARS Coronavirus 2 NEGATIVE NEGATIVE Final    Comment: (NOTE) If result is NEGATIVE SARS-CoV-2 target nucleic acids are NOT DETECTED. The SARS-CoV-2 RNA is generally detectable in upper and lower  respiratory specimens during the acute phase of infection. The lowest  concentration of SARS-CoV-2 viral copies this assay can detect is 250  copies / mL. A negative result does not preclude SARS-CoV-2 infection  and should not be used as the sole basis for treatment or other  patient management decisions.  A negative result may occur with  improper specimen collection / handling, submission of specimen other  than nasopharyngeal swab, presence of viral mutation(s) within the  areas targeted by this assay, and inadequate number of viral copies  (<250 copies / mL). A negative result must be combined with clinical  observations, patient history, and epidemiological information. If result is POSITIVE SARS-CoV-2 target nucleic acids are DETECTED. The SARS-CoV-2 RNA is generally detectable in upper and lower  respiratory specimens dur ing the acute phase of infection.  Positive  results are indicative of active infection with SARS-CoV-2.  Clinical  correlation with patient history and other diagnostic  information is  necessary to determine patient infection status.  Positive results do  not rule out bacterial infection or co-infection with other viruses. If result is PRESUMPTIVE POSTIVE SARS-CoV-2 nucleic acids MAY BE PRESENT.   A presumptive positive result was obtained on the submitted specimen  and confirmed on repeat testing.  While 2019 novel coronavirus  (SARS-CoV-2) nucleic acids may be present in the submitted sample  additional confirmatory testing may be necessary for epidemiological  and / or clinical management purposes  to differentiate between  SARS-CoV-2 and other Sarbecovirus currently known to infect humans.  If clinically indicated additional testing with an alternate test  methodology [phone]) is advised. The SARS-CoV-2 RNA is generally  detectable in upper and lower respiratory sp ecimens during the acute  phase of infection. The expected result is Negative. Fact Sheet for Patients:  StrictlyIdeas.no Fact Sheet for Healthcare Providers: BankingDealers.co.za This test is not yet approved or cleared by the [place] FDA and has been authorized for detection and/or diagnosis of SARS-CoV-2 by FDA under an Emergency Use Authorization (EUA).  This EUA will remain in effect (meaning this test can be used) for the duration of the COVID-19 declaration under Section 564(b)(1) of the Act, 21 U.S.C. section 360bbb-3(b)(1), unless the authorization is terminated or revoked sooner. Performed at [facility], [place] [address] [place], [place] [address]      Time coordinating discharge: 40 minutes  SIGNED:   Elmarie Shiley, MD  [facility]

## 2019-04-12 NOTE — Progress Notes (Addendum)
    Durable Medical Equipment  (From admission, onward)         Start     Ordered   04/11/19 1411  For home use only DME Hospital bed  Once    Question Answer Comment  Length of Need 6 Months   The above medical condition requires: Patient requires the ability to reposition frequently   Head must be elevated greater than: 45 degrees   Bed type Semi-electric      04/11/19 1410

## 2019-04-13 ENCOUNTER — Telehealth: Payer: Self-pay | Admitting: Emergency Medicine

## 2019-04-13 NOTE — Telephone Encounter (Signed)
Pt's OV notes from consult with RB and PET that pt has recently have been faxed to [facility] in [employer]. Nothing further needed.

## 2019-04-15 ENCOUNTER — Telehealth: Payer: Self-pay | Admitting: Emergency Medicine

## 2019-04-15 NOTE — Telephone Encounter (Signed)
Recommend sending to Dr. Lamonte Sakai, I think he should go over results with them. If unable to I can contact them this afternoon.

## 2019-04-15 NOTE — Telephone Encounter (Signed)
Pt had PET performed 7/7 and pt's daughter [name] is calling requesting to know the results of the PET.   Beth, please advise on this for pt and daughter. Thanks!

## 2019-04-15 NOTE — Telephone Encounter (Signed)
Routing to Dr. Lamonte Sakai. Dr. Lamonte Sakai, please advise on this for pt. Thanks!

## 2019-04-18 NOTE — Telephone Encounter (Signed)
I reviewed the PET results with Sylvia Suarez - consistent with Stage 4 disease, vertebral involvement.  While bronchoscopy was endobronchial ultrasound, navigation would likely be the most sensitive test, unclear to me that the patient could tolerate general anesthesia.  Based on my discussion with Sylvia Suarez today is not clear to me that she could even do pulmonary function testing.  Needle biopsy of the left lung mass would be an option.  Most importantly we need to clarify the patient's goals for her care.  She may not want chemotherapy, radiation therapy even with the cancer diagnosis.  If this is the case then I would forego a biopsy unless we felt it would facilitate palliative radiation to her spine for back pain.  Sylvia Suarez is going to discuss this further with the patient prior to their phone visit with Eric Form on 7/15.  Copy to SG as FYI

## 2019-04-20 ENCOUNTER — Ambulatory Visit (INDEPENDENT_AMBULATORY_CARE_PROVIDER_SITE_OTHER): Payer: Medicare Other | Admitting: Acute Care

## 2019-04-20 ENCOUNTER — Encounter: Payer: Self-pay | Admitting: Acute Care

## 2019-04-20 ENCOUNTER — Other Ambulatory Visit: Payer: Self-pay

## 2019-04-20 DIAGNOSIS — C3492 Malignant neoplasm of unspecified part of left bronchus or lung: Secondary | ICD-10-CM | POA: Diagnosis not present

## 2019-04-20 NOTE — Progress Notes (Addendum)
TELEPHONE ENCOUNTER  Shatika Lohse's daughter in law, Shannia Jacuinde, contacted chaplain office and left voicemail at 09:15 04/20/2019.  Chaplain called back at 09:50 04/20/2019 and confirmed identity with two patient identifiers.  Anette reports that Nayda remembers doing DNR or Living Will while at [facility] remembers this from night of admission.  There is no record of these in chart.  Hopes to be able to obtain copies of these if Simmie filled out during admission.  Due to phone conversation, Chaplain provided HIPPA compliant information and Chaplain provided education regarding Advance Directive Process and DNR, as well as MOST.   Anette reports Keilani was confused on admission and states she may be mis-remembering admitting nurse asking whether she has Living Will.   Reports Tavaria has HCPOA documentation as part of her will.  Chaplain encouraged family to file these with medical records.     Jerene Pitch, MDiv, [facility]

## 2019-04-20 NOTE — Patient Instructions (Signed)
Thank you for speaking with me today. We will place a referral to [facility] as we discussed. Please let us know if you need anything in the inter rum.  Call for pain medication if needed prior to establishing with Hospice. As always, it is our pleasure to be a part of your healthcare  team.  Please contact office for sooner follow up if symptoms do not improve or worsen or seek emergency care

## 2019-04-20 NOTE — Progress Notes (Signed)
Virtual Visit via Telephone Note  I connected with Sylvia Suarez on 04/20/19 at  3:00 PM EDT by telephone and verified that I am speaking with the correct person using two identifiers.  Location: Patient: At home Provider: At the office at [place], [place], [address]   I discussed the limitations, risks, security and privacy concerns of performing an evaluation and management service by telephone and the availability of in person appointments. I also discussed with the patient that there may be a patient responsible charge related to this service. The patient expressed understanding and agreed to proceed.  I  confirmed date of birth and address to authenticate patient  Identity. My nurse Quentin Ore reviewed medications and ordered any refills required.   History of Present Illness: Pt. is an 83 year old former smoker ( Quit 1997 with a 120 pack year smoking history) with back pain and a left upper lobe lung nodule that per PET scan is Stage IV disease with vertebral involvement. Dr. Lamonte Sakai had a long conversation with Sylvia Suarez, the patient's daughter. He explained in detail, the patient's options for further diagnostics and treatment. He urged the family to talk with the patient to see if she would even want to pursue aggressive treatment vs. Evaluating goals of care. Sylvia Suarez, the patient's daughter and the family have discussed this as a group. Sylvia Suarez has decided she would prefer to have referral to [place] for palliative care and pain management. We will place the order for referral to [facility] as urgent. I told Sylvia Suarez if her mother needs additional pain medication before she is set up with Hospice to call the office. She verbalized understanding and had no further questions at the conclusion of the call.The order was placed for urgent referral. I have seen the note regarding need for DNR paperwork. This will be addressed by Hospice at time of  referral if the hospital paperwork is not found.   Observations/Objective: PET CT 04/12/2019 3 cm hypermetabolic spiculated mass in the posterior left upper lobe, consistent with primary bronchogenic carcinoma.  Multiple sub-cm bilateral pulmonary nodules, some showing mild FDG uptake, suspicious for bilateral pulmonary metastases.  Tiny bilateral pleural effusions.  Metastatic lymphadenopathy in the left hilum, mediastinum, and bilateral supraclavicular regions.  Diffuse hypermetabolic bone metastases.   Assessment and Plan: Referral to [facility] for Palliation, Comfort Care and pain management   Follow Up Instructions: [name]    I discussed the assessment and treatment plan with the patient. The patient was provided an opportunity to ask questions and all were answered. The patient agreed with the plan and demonstrated an understanding of the instructions.   The patient was advised to call back or seek an in-person evaluation if the symptoms worsen or if the condition fails to improve as anticipated.  I provided 23 minutes of non-face-to-face time during this encounter.   Magdalen Spatz, NP 04/20/2019 3:35 PM

## 2019-04-22 ENCOUNTER — Inpatient Hospital Stay: Payer: Medicare Other | Admitting: Adult Health

## 2019-04-29 ENCOUNTER — Institutional Professional Consult (permissible substitution): Payer: Medicare Other | Admitting: Internal Medicine

## 2019-08-07 DEATH — deceased

## 2020-08-06 DEATH — deceased

## 2021-08-06 DEATH — deceased
